# Patient Record
Sex: Female | Born: 1935 | Race: White | Hispanic: No | Smoking: Former smoker
Health system: Southern US, Community
[De-identification: ages and names within clinical notes are randomized; demographics above are authoritative.]

## PROBLEM LIST (undated history)

## (undated) DIAGNOSIS — F419 Anxiety disorder, unspecified: Secondary | ICD-10-CM

## (undated) DIAGNOSIS — I10 Essential (primary) hypertension: Secondary | ICD-10-CM

## (undated) DIAGNOSIS — J811 Chronic pulmonary edema: Secondary | ICD-10-CM

## (undated) DIAGNOSIS — D649 Anemia, unspecified: Secondary | ICD-10-CM

## (undated) DIAGNOSIS — I5189 Other ill-defined heart diseases: Secondary | ICD-10-CM

## (undated) DIAGNOSIS — E876 Hypokalemia: Secondary | ICD-10-CM

## (undated) DIAGNOSIS — M6281 Muscle weakness (generalized): Secondary | ICD-10-CM

## (undated) DIAGNOSIS — F329 Major depressive disorder, single episode, unspecified: Secondary | ICD-10-CM

## (undated) DIAGNOSIS — N183 Chronic kidney disease, stage 3 (moderate): Secondary | ICD-10-CM

## (undated) DIAGNOSIS — F29 Unspecified psychosis not due to a substance or known physiological condition: Secondary | ICD-10-CM

## (undated) DIAGNOSIS — E7800 Pure hypercholesterolemia, unspecified: Secondary | ICD-10-CM

## (undated) DIAGNOSIS — R011 Cardiac murmur, unspecified: Secondary | ICD-10-CM

## (undated) DIAGNOSIS — M1990 Unspecified osteoarthritis, unspecified site: Secondary | ICD-10-CM

## (undated) DIAGNOSIS — I509 Heart failure, unspecified: Secondary | ICD-10-CM

## (undated) DIAGNOSIS — F0390 Unspecified dementia without behavioral disturbance: Secondary | ICD-10-CM

## (undated) DIAGNOSIS — L03116 Cellulitis of left lower limb: Secondary | ICD-10-CM

## (undated) DIAGNOSIS — R1310 Dysphagia, unspecified: Secondary | ICD-10-CM

## (undated) HISTORY — DX: Cardiac murmur, unspecified: R01.1

## (undated) HISTORY — DX: Muscle weakness (generalized): M62.81

## (undated) HISTORY — DX: Dysphagia, unspecified: R13.10

## (undated) HISTORY — DX: Unspecified dementia, unspecified severity, without behavioral disturbance, psychotic disturbance, mood disturbance, and anxiety: F03.90

## (undated) HISTORY — PX: KIDNEY CYST REMOVAL: SHX684

## (undated) HISTORY — DX: Unspecified psychosis not due to a substance or known physiological condition: F29

## (undated) HISTORY — DX: Hypokalemia: E87.6

## (undated) HISTORY — DX: Anemia, unspecified: D64.9

## (undated) HISTORY — DX: Major depressive disorder, single episode, unspecified: F32.9

## (undated) HISTORY — DX: Heart failure, unspecified: I50.9

---

## 2002-03-04 ENCOUNTER — Other Ambulatory Visit: Admission: RE | Admit: 2002-03-04 | Discharge: 2002-03-04 | Payer: Self-pay | Admitting: Family Medicine

## 2002-03-15 ENCOUNTER — Ambulatory Visit (HOSPITAL_COMMUNITY): Admission: RE | Admit: 2002-03-15 | Discharge: 2002-03-15 | Payer: Self-pay | Admitting: Family Medicine

## 2004-11-05 ENCOUNTER — Other Ambulatory Visit: Admission: RE | Admit: 2004-11-05 | Discharge: 2004-11-05 | Payer: Self-pay | Admitting: Family Medicine

## 2008-12-14 ENCOUNTER — Ambulatory Visit (HOSPITAL_COMMUNITY): Admission: RE | Admit: 2008-12-14 | Discharge: 2008-12-14 | Payer: Self-pay | Admitting: Ophthalmology

## 2009-01-17 ENCOUNTER — Ambulatory Visit (HOSPITAL_COMMUNITY): Admission: RE | Admit: 2009-01-17 | Discharge: 2009-01-17 | Payer: Self-pay | Admitting: Ophthalmology

## 2009-10-26 ENCOUNTER — Emergency Department (HOSPITAL_COMMUNITY): Admission: EM | Admit: 2009-10-26 | Discharge: 2009-10-26 | Payer: Self-pay | Admitting: Emergency Medicine

## 2010-02-03 ENCOUNTER — Emergency Department (HOSPITAL_COMMUNITY): Admission: EM | Admit: 2010-02-03 | Discharge: 2010-02-03 | Payer: Self-pay | Admitting: Emergency Medicine

## 2011-01-05 LAB — BASIC METABOLIC PANEL
GFR calc Af Amer: >60 mL/min (ref >60)
GFR calc non Af Amer: 53 mL/min — ABNORMAL LOW (ref >60)
Glucose, Bld: 166 mg/dL — ABNORMAL HIGH (ref 70–99)
Sodium: 139 mEq/L (ref 135–145)

## 2011-01-05 LAB — COMPREHENSIVE METABOLIC PANEL
ALT: 9 U/L (ref 0–35)
Albumin: 3.4 g/dL — ABNORMAL LOW (ref 3.5–5.2)
BUN: 13 mg/dL (ref 6–23)
Calcium: 9 mg/dL (ref 8.4–10.5)
GFR calc Af Amer: >60 mL/min (ref >60)
Glucose, Bld: 168 mg/dL — ABNORMAL HIGH (ref 70–99)
Total Protein: 6.8 g/dL (ref 6.0–8.3)

## 2011-01-05 LAB — URINE MICROSCOPIC-ADD ON

## 2011-01-05 LAB — URINALYSIS, ROUTINE W REFLEX MICROSCOPIC
Glucose, UA: NEGATIVE mg/dL
Urobilinogen, UA: 0.2 mg/dL (ref 0.0–1.0)

## 2011-01-05 LAB — DIFFERENTIAL
Basophils Absolute: 0 10*3/uL (ref 0.0–0.1)
Basophils Relative: 1 % (ref 0–1)
Neutro Abs: 3.1 10*3/uL (ref 1.7–7.7)
Neutrophils Relative %: 65 % (ref 43–77)

## 2011-01-05 LAB — CBC
MCHC: 33 g/dL (ref 30.0–36.0)
RDW: 13.8 % (ref 11.5–15.5)

## 2011-01-05 LAB — T4, FREE: Free T4: 1.38 ng/dL (ref 0.80–1.80)

## 2011-02-04 LAB — BASIC METABOLIC PANEL
CO2: 31 mEq/L (ref 19–32)
Calcium: 10 mg/dL (ref 8.4–10.5)
Creatinine, Ser: 0.99 mg/dL (ref 0.4–1.2)
GFR calc Af Amer: >60 mL/min (ref >60)
GFR calc non Af Amer: 55 mL/min — ABNORMAL LOW (ref >60)
Sodium: 141 mEq/L (ref 135–145)

## 2011-06-02 ENCOUNTER — Encounter: Payer: Self-pay | Admitting: *Deleted

## 2011-06-02 ENCOUNTER — Emergency Department (HOSPITAL_COMMUNITY): Payer: Medicare Other

## 2011-06-02 ENCOUNTER — Emergency Department (HOSPITAL_COMMUNITY)
Admission: EM | Admit: 2011-06-02 | Discharge: 2011-06-03 | Disposition: A | Discharge status: Home / Self Care | Payer: Medicare Other | Attending: Emergency Medicine | Admitting: Emergency Medicine

## 2011-06-02 ENCOUNTER — Other Ambulatory Visit: Payer: Self-pay

## 2011-06-02 DIAGNOSIS — K5289 Other specified noninfective gastroenteritis and colitis: Secondary | ICD-10-CM | POA: Insufficient documentation

## 2011-06-02 DIAGNOSIS — I10 Essential (primary) hypertension: Secondary | ICD-10-CM | POA: Insufficient documentation

## 2011-06-02 DIAGNOSIS — E7800 Pure hypercholesterolemia, unspecified: Secondary | ICD-10-CM | POA: Insufficient documentation

## 2011-06-02 DIAGNOSIS — F411 Generalized anxiety disorder: Secondary | ICD-10-CM | POA: Insufficient documentation

## 2011-06-02 HISTORY — DX: Anxiety disorder, unspecified: F41.9

## 2011-06-02 HISTORY — DX: Unspecified osteoarthritis, unspecified site: M19.90

## 2011-06-02 HISTORY — DX: Pure hypercholesterolemia, unspecified: E78.00

## 2011-06-02 HISTORY — DX: Essential (primary) hypertension: I10

## 2011-06-02 LAB — COMPREHENSIVE METABOLIC PANEL
ALT: 11 U/L (ref 0–35)
BUN: 32 mg/dL — ABNORMAL HIGH (ref 6–23)
CO2: 22 mEq/L (ref 19–32)
Calcium: 9.8 mg/dL (ref 8.4–10.5)
Creatinine, Ser: 1.51 mg/dL — ABNORMAL HIGH (ref 0.50–1.10)
GFR calc Af Amer: 40 mL/min — ABNORMAL LOW (ref >60)
GFR calc non Af Amer: 33 mL/min — ABNORMAL LOW (ref >60)
Glucose, Bld: 164 mg/dL — ABNORMAL HIGH (ref 70–99)
Total Protein: 7.6 g/dL (ref 6.0–8.3)

## 2011-06-02 LAB — URINALYSIS, ROUTINE W REFLEX MICROSCOPIC
Ketones, ur: NEGATIVE mg/dL
Leukocytes, UA: NEGATIVE
Nitrite: NEGATIVE
Protein, ur: NEGATIVE mg/dL

## 2011-06-02 LAB — CARDIAC PANEL(CRET KIN+CKTOT+MB+TROPI)
CK, MB: 2.2 ng/mL (ref 0.3–4.0)
Total CK: 54 U/L (ref 7–177)

## 2011-06-02 LAB — DIFFERENTIAL
Basophils Absolute: 0 10*3/uL (ref 0.0–0.1)
Basophils Relative: 0 % (ref 0–1)
Neutro Abs: 8.3 10*3/uL — ABNORMAL HIGH (ref 1.7–7.7)
Neutrophils Relative %: 87 % — ABNORMAL HIGH (ref 43–77)

## 2011-06-02 LAB — CBC
HCT: 40.2 % (ref 36.0–46.0)
Hemoglobin: 13.1 g/dL (ref 12.0–15.0)
WBC: 9.5 10*3/uL (ref 4.0–10.5)

## 2011-06-02 LAB — LIPASE, BLOOD: Lipase: 29 U/L (ref 11–59)

## 2011-06-02 MED ORDER — IOHEXOL 300 MG/ML  SOLN
80.0000 mL | Freq: Once | INTRAMUSCULAR | Status: AC | PRN
  Administered 2011-06-02: 80 mL via INTRAVENOUS

## 2011-06-02 MED ORDER — SODIUM CHLORIDE 0.9 % IV SOLN
INTRAVENOUS | Status: DC
  Administered 2011-06-02: 21:00:00 via INTRAVENOUS

## 2011-06-02 NOTE — ED Notes (Signed)
Episode of abdominal pain with diaphoresis prior to arrival

## 2011-06-02 NOTE — ED Notes (Signed)
Performed by Stephens Shire NT assisted by [facility] NT. Clean clear in color. Urinating without difficulty.

## 2011-06-02 NOTE — ED Provider Notes (Signed)
History     CSN: [id number] Arrival date & time: 06/02/2011  7:52 PM  Chief Complaint  Patient presents with  . Abdominal Pain   HPI  Pt was seen at 2005.  Per pt, c/o gradual onset and persistence of constant generalized abd "pain" that began PTA.  Pt describes the abd pain as "cramping."  States she was sitting on the commode "for a while" to try to have a BM, only urinated, then "began to sweat" and feel "lightheaded."  Denies syncope, no CP/palpitations, no SOB/cough, no back pain, no focal motor weakness, no tingling/numbness in extremities, no visual changes, no headache, no falls, no black or blood in stools, no N/V/D.     Past Medical History  Diagnosis Date  . Arthritis   . Hypercholesteremia   . Hypertension   . Anxiety     Past Surgical History  Procedure Date  . Kidney cyst removal     No family history on file.  History  Substance Use Topics  . Smoking status: Not on file  . Smokeless tobacco: Not on file  . Alcohol Use: No    OB History    Grav Para Term Preterm Abortions TAB SAB Ect Mult Living                  Review of Systems ROS: Statement: All systems negative except as marked or noted in the HPI; Constitutional: Negative for fever and chills. ; ; Eyes: Negative for eye pain and discharge. ; ; ENMT: Negative for ear pain, hoarseness, nasal congestion, sinus pressure and sore throat. ; ; Cardiovascular: Negative for chest pain, palpitations, dyspnea and peripheral edema. +diaphoresis. ; Respiratory: Negative for cough, wheezing and stridor. ; ; Gastrointestinal: Negative for nausea, vomiting, diarrhea and +abdominal pain/cramping. No black or blood in stools ; ; Genitourinary: Negative for dysuria, flank pain and hematuria. ; ; Musculoskeletal: Negative for back pain and neck pain. ; ; Skin: Negative for rash and skin lesion. ; ; Neuro: Negative for headache, lightheadedness and neck stiffness.  No visual changes, no focal motor weakness, no  tingling/numbness in extremities.      Physical Exam  BP 117/48  Pulse 89  Temp(Src) 97.4 F (36.3 C) (Oral)  Resp 16  Ht 5\' 3"  (1.6 m)  Wt 197 lb (89.359 kg)  BMI 34.90 kg/m2  SpO2 99%  Physical Exam 2010: Physical examination:  Nursing notes reviewed; Vital signs and O2 SAT reviewed;  Constitutional: Well developed, Well nourished, In no acute distress; Head:  Normocephalic, atraumatic; Eyes: EOMI, PERRL, No scleral icterus; ENMT: Mouth and pharynx normal, Mucous membranes dry; Neck: Supple, Full range of motion, No lymphadenopathy; Cardiovascular: Regular rate and rhythm, No murmur, rub, or gallop; Respiratory: Breath sounds clear & equal bilaterally, No rales, rhonchi, wheezes, or rub, Normal respiratory effort/excursion; Chest: Nontender, Movement normal; Abdomen: Soft, +mild diffuse TTP, no rebound or guarding, no rash, Nondistended, Normal bowel sounds; Genitourinary: No CVA tenderness; Extremities: Pulses normal, No tenderness, No edema, No calf edema or asymmetry.; Neuro: AA&Ox3, Major CN grossly intact. Speech clear, no facial droop.  No gross focal motor or sensory deficits in extremities.; Skin: Color normal, Warm, Dry.   ED Course  Procedures  MDM MDM Reviewed: nursing note, vitals and previous chart Reviewed previous: ECG and labs Interpretation: labs, ECG, x-ray and CT scan    Date: 06/02/2011  Rate: 84  Rhythm: normal sinus rhythm  QRS Axis: left  Intervals: normal  ST/T Wave abnormalities: normal  Conduction Disutrbances:none  Narrative Interpretation:   Old EKG Reviewed: unchanged, no significant changes from previous EKG dated 12/08/2008.   Results for orders placed during the hospital encounter of 06/02/11  CARDIAC PANEL(CRET KIN+CKTOT+MB+TROPI)      Component Value Range   Total CK 54  7 - 177 (U/L)   CK, MB 2.2  0.3 - 4.0 (ng/mL)   Troponin I <0.30  <0.30 (ng/mL)   Relative Index RELATIVE INDEX IS INVALID  0.0 - 2.5   CBC      Component Value  Range   WBC 9.5  4.0 - 10.5 (K/uL)   RBC 4.42  3.87 - 5.11 (MIL/uL)   Hemoglobin 13.1  12.0 - 15.0 (g/dL)   HCT 16.1  09.6 - 04.5 (%)   MCV 91.0  78.0 - 100.0 (fL)   MCH 29.6  26.0 - 34.0 (pg)   MCHC 32.6  30.0 - 36.0 (g/dL)   RDW 40.9  81.1 - 91.4 (%)   Platelets 158  150 - 400 (K/uL)  DIFFERENTIAL      Component Value Range   Neutrophils Relative 87 (*) 43 - 77 (%)   Neutro Abs 8.3 (*) 1.7 - 7.7 (K/uL)   Lymphocytes Relative 10 (*) 12 - 46 (%)   Lymphs Abs 0.9  0.7 - 4.0 (K/uL)   Monocytes Relative 3  3 - 12 (%)   Monocytes Absolute 0.3  0.1 - 1.0 (K/uL)   Eosinophils Relative 1  0 - 5 (%)   Eosinophils Absolute 0.1  0.0 - 0.7 (K/uL)   Basophils Relative 0  0 - 1 (%)   Basophils Absolute 0.0  0.0 - 0.1 (K/uL)  COMPREHENSIVE METABOLIC PANEL      Component Value Range   Sodium 138  135 - 145 (mEq/L)   Potassium 4.6  3.5 - 5.1 (mEq/L)   Chloride 102  96 - 112 (mEq/L)   CO2 22  19 - 32 (mEq/L)   Glucose, Bld 164 (*) 70 - 99 (mg/dL)   BUN 32 (*) 6 - 23 (mg/dL)   Creatinine, Ser 7.82 (*) 0.50 - 1.10 (mg/dL)   Calcium 9.8  8.4 - 95.6 (mg/dL)   Total Protein 7.6  6.0 - 8.3 (g/dL)   Albumin 3.8  3.5 - 5.2 (g/dL)   AST 16  0 - 37 (U/L)   ALT 11  0 - 35 (U/L)   Alkaline Phosphatase 92  39 - 117 (U/L)   Total Bilirubin 0.2 (*) 0.3 - 1.2 (mg/dL)   GFR calc non Af Amer 33 (*) >60 (mL/min)   GFR calc Af Amer 40 (*) >60 (mL/min)  LIPASE, BLOOD      Component Value Range   Lipase 29  11 - 59 (U/L)  URINALYSIS, ROUTINE W REFLEX MICROSCOPIC      Component Value Range   Color, Urine YELLOW  YELLOW    Appearance CLEAR  CLEAR    Specific Gravity, Urine 1.025  1.005 - 1.030    pH 5.5  5.0 - 8.0    Glucose, UA NEGATIVE  NEGATIVE (mg/dL)   Hgb urine dipstick NEGATIVE  NEGATIVE    Bilirubin Urine NEGATIVE  NEGATIVE    Ketones, ur NEGATIVE  NEGATIVE (mg/dL)   Protein, ur NEGATIVE  NEGATIVE (mg/dL)   Urobilinogen, UA 0.2  0.0 - 1.0 (mg/dL)   Nitrite NEGATIVE  NEGATIVE    Leukocytes, UA  NEGATIVE  NEGATIVE    Results for SHENIA, ALAN (MRN [id number]) as of 06/03/2011 01:25  Ref. Range 12/08/2008  14:40 10/26/2009 12:51 10/26/2009 14:36 06/02/2011 20:37  BUN Latest Range: 6-23 mg/dL 16 13 13  32 (H)  Creat Latest Range: 0.50-1.10 mg/dL 1.61 0.96 0.45 4.09 (H)   Dg Chest 2 View  06/02/2011  *RADIOLOGY REPORT*  Clinical Data: Abdominal pain.  Weakness.  CHEST - 2 VIEW  Comparison: None.  Findings: Thoracic spondylosis noted. The patient is rotated to the left on today's exam, resulting in reduced diagnostic sensitivity and specificity.  Cardiac and mediastinal contours appear normal.  The lungs appear clear.  No pleural effusion is identified.  IMPRESSION:  1.  Thoracic spondylosis. 2.   Otherwise, no significant abnormality identified.  Original Report Authenticated By: Dellia Cloud, M.D.   Ct Abdomen Pelvis W Contrast  06/02/2011  *RADIOLOGY REPORT*  Clinical Data: Abdominal pain.  Diaphoresis.  CT ABDOMEN AND PELVIS WITH CONTRAST  Technique:  Multidetector CT imaging of the abdomen and pelvis was performed following the standard protocol during bolus administration of intravenous contrast.  Contrast: 80 ml Omnipaque-300  Comparison: None.  Findings: The liver, spleen, pancreas, and adrenal glands appear unremarkable.  The gallbladder and biliary system appear unremarkable.  No pathologic retroperitoneal or porta hepatis adenopathy is identified.  Simple appearing 3.9 cm left lower pole renal cyst noted.  The appendix appears normal.  There is borderline wall thickening in the descending colon which may reflect mild colitis.  The remainder of the colon appears unremarkable.  The uterus and adnexa appear unremarkable.  Urinary bladder appears unremarkable.  Lumbar spondylosis and degenerative disc disease noted along with lumbar epidural lipomatosis.  There is likely foraminal stenosis on the left at the L4-5 and L5-S1 levels.  There are scattered air-fluid levels in nondilated small bowel.   IMPRESSION:  1.  Potential wall thickening in the descending colon, query mild colitis. 2.  Small air-fluid levels in nondilated small bowel which could be reflection of mild enteritis or mild ileus. 4.  Benign appearing left renal cyst. 5.  Lumbar spondylosis and degenerative disc disease.  Original Report Authenticated By: Dellia Cloud, M.D.    0100:  Pt ambulated in ED with steady gait, easy resps.  Has used bathroom without N/V/D.  Not orthostatic.  WBC normal, not acidotic.  Afebrile.  Mild Cr elevation today.  Given IVF and encouraged to increase PO over next several days.  Has tol PO well while in ED.  Pt wants to go home now and family wants to take pt home now.  Will give first dose of abx for possible mild colitis on CT A/P.  Aorta normal.  Dx testing d/w pt and family.  Questions answered.  Verb understanding, agreeable to d/c home with outpt f/u.       Vashon Arch Allison Quarry, DO 06/03/11 1453

## 2011-06-03 MED ORDER — METRONIDAZOLE 500 MG PO TABS: 500.0000 mg | ORAL_TABLET | Freq: Two times a day (BID) | ORAL | Status: AC

## 2011-06-03 MED ORDER — CIPROFLOXACIN HCL 250 MG PO TABS
500.0000 mg | ORAL_TABLET | Freq: Once | ORAL | Status: AC
  Administered 2011-06-03: 500 mg via ORAL
  Filled 2011-06-03: qty 2

## 2011-06-03 MED ORDER — CIPROFLOXACIN HCL 500 MG PO TABS: 500.0000 mg | ORAL_TABLET | Freq: Two times a day (BID) | ORAL | Status: AC

## 2011-06-03 MED ORDER — METRONIDAZOLE 500 MG PO TABS
500.0000 mg | ORAL_TABLET | Freq: Once | ORAL | Status: AC
  Administered 2011-06-03: 500 mg via ORAL
  Filled 2011-06-03: qty 1

## 2011-06-03 NOTE — ED Notes (Signed)
Patient ambulatory to bathroom with steady gait.  Patient states she only urinated.

## 2011-06-04 LAB — URINE CULTURE: Culture  Setup Time: 201208141402

## 2013-04-02 ENCOUNTER — Encounter (HOSPITAL_COMMUNITY): Payer: Self-pay | Admitting: Emergency Medicine

## 2013-04-02 ENCOUNTER — Emergency Department (HOSPITAL_COMMUNITY)
Admission: EM | Admit: 2013-04-02 | Discharge: 2013-04-02 | Disposition: A | Discharge status: Home / Self Care | Payer: Medicare Other | Attending: Emergency Medicine | Admitting: Emergency Medicine

## 2013-04-02 ENCOUNTER — Emergency Department (HOSPITAL_COMMUNITY): Payer: Medicare Other

## 2013-04-02 DIAGNOSIS — S81809A Unspecified open wound, unspecified lower leg, initial encounter: Secondary | ICD-10-CM | POA: Insufficient documentation

## 2013-04-02 DIAGNOSIS — S59909A Unspecified injury of unspecified elbow, initial encounter: Secondary | ICD-10-CM | POA: Insufficient documentation

## 2013-04-02 DIAGNOSIS — S81811A Laceration without foreign body, right lower leg, initial encounter: Secondary | ICD-10-CM

## 2013-04-02 DIAGNOSIS — Y9289 Other specified places as the place of occurrence of the external cause: Secondary | ICD-10-CM | POA: Insufficient documentation

## 2013-04-02 DIAGNOSIS — F329 Major depressive disorder, single episode, unspecified: Secondary | ICD-10-CM | POA: Insufficient documentation

## 2013-04-02 DIAGNOSIS — W010XXA Fall on same level from slipping, tripping and stumbling without subsequent striking against object, initial encounter: Secondary | ICD-10-CM | POA: Insufficient documentation

## 2013-04-02 DIAGNOSIS — S6990XA Unspecified injury of unspecified wrist, hand and finger(s), initial encounter: Secondary | ICD-10-CM | POA: Insufficient documentation

## 2013-04-02 DIAGNOSIS — Z79899 Other long term (current) drug therapy: Secondary | ICD-10-CM | POA: Insufficient documentation

## 2013-04-02 DIAGNOSIS — Z23 Encounter for immunization: Secondary | ICD-10-CM | POA: Insufficient documentation

## 2013-04-02 DIAGNOSIS — S0003XA Contusion of scalp, initial encounter: Secondary | ICD-10-CM | POA: Insufficient documentation

## 2013-04-02 DIAGNOSIS — F411 Generalized anxiety disorder: Secondary | ICD-10-CM | POA: Insufficient documentation

## 2013-04-02 DIAGNOSIS — Z8639 Personal history of other endocrine, nutritional and metabolic disease: Secondary | ICD-10-CM | POA: Insufficient documentation

## 2013-04-02 DIAGNOSIS — F3289 Other specified depressive episodes: Secondary | ICD-10-CM | POA: Insufficient documentation

## 2013-04-02 DIAGNOSIS — W19XXXA Unspecified fall, initial encounter: Secondary | ICD-10-CM

## 2013-04-02 DIAGNOSIS — I10 Essential (primary) hypertension: Secondary | ICD-10-CM | POA: Insufficient documentation

## 2013-04-02 DIAGNOSIS — S0083XA Contusion of other part of head, initial encounter: Secondary | ICD-10-CM

## 2013-04-02 DIAGNOSIS — W1809XA Striking against other object with subsequent fall, initial encounter: Secondary | ICD-10-CM | POA: Insufficient documentation

## 2013-04-02 DIAGNOSIS — M129 Arthropathy, unspecified: Secondary | ICD-10-CM | POA: Insufficient documentation

## 2013-04-02 DIAGNOSIS — Y9389 Activity, other specified: Secondary | ICD-10-CM | POA: Insufficient documentation

## 2013-04-02 DIAGNOSIS — Z862 Personal history of diseases of the blood and blood-forming organs and certain disorders involving the immune mechanism: Secondary | ICD-10-CM | POA: Insufficient documentation

## 2013-04-02 DIAGNOSIS — S81009A Unspecified open wound, unspecified knee, initial encounter: Secondary | ICD-10-CM | POA: Insufficient documentation

## 2013-04-02 MED ORDER — LIDOCAINE-EPINEPHRINE (PF) 2 %-1:200000 IJ SOLN
INTRAMUSCULAR | Status: AC
  Filled 2013-04-02: qty 20

## 2013-04-02 MED ORDER — TETANUS-DIPHTH-ACELL PERTUSSIS 5-2.5-18.5 LF-MCG/0.5 IM SUSP
0.5000 mL | Freq: Once | INTRAMUSCULAR | Status: AC
  Administered 2013-04-02: 0.5 mL via INTRAMUSCULAR
  Filled 2013-04-02: qty 0.5

## 2013-04-02 MED ORDER — BACITRACIN ZINC 500 UNIT/GM EX OINT
TOPICAL_OINTMENT | CUTANEOUS | Status: AC
  Administered 2013-04-02: 1
  Filled 2013-04-02: qty 0.9

## 2013-04-02 NOTE — ED Notes (Signed)
Unknown date of last tetanus shot. Denies taking blood thinners. Alert/oriented x 4. No distress.

## 2013-04-02 NOTE — ED Notes (Signed)
Discharge instructions reviewed with pt, questions answered. Pt verbalized understanding.  

## 2013-04-02 NOTE — ED Notes (Signed)
Patient arrives with family member with c/o fall today down three steps. Patient reports slipping. Family reports it is three brick stairs and patient landed on concrete pad at the bottom. She did hit her head. Denies LOC. Abrasion to bilateral forearms. Laceration to right leg just below the knee. Abrasion noted to right periorbital area.

## 2013-04-02 NOTE — ED Notes (Signed)
Suture cart at bedside 

## 2013-04-02 NOTE — ED Provider Notes (Addendum)
History     CSN: [id number]  Arrival date & time 04/02/13  1635   First MD Initiated Contact with Patient 04/02/13 1652      Chief Complaint  Patient presents with  . Fall    (Consider location/radiation/quality/duration/timing/severity/associated sxs/prior treatment) Patient is a 79 y.o. female presenting with fall. The history is provided by the patient.  Fall This is a new problem. Pertinent negatives include no chest pain, no abdominal pain, no headaches and no shortness of breath.   patient tripped and fell down some stairs. She states she has pain in her right knee, head, right forearm, and left forearm. No loss of consciousness. Congestion. No abdominal pain. No confusion. She is not on anticoagulation. No difficulty breathing. No nausea vomiting or diarrhea.  Past Medical History  Diagnosis Date  . Arthritis   . Hypercholesteremia   . Hypertension   . Anxiety     Past Surgical History  Procedure Laterality Date  . Kidney cyst removal      No family history on file.  History  Substance Use Topics  . Smoking status: Not on file  . Smokeless tobacco: Not on file  . Alcohol Use: No    OB History   Grav Para Term Preterm Abortions TAB SAB Ect Mult Living                  Review of Systems  Constitutional: Negative for activity change and appetite change.  HENT: Negative for neck stiffness.   Eyes: Negative for pain.  Respiratory: Negative for chest tightness and shortness of breath.   Cardiovascular: Negative for chest pain and leg swelling.  Gastrointestinal: Negative for nausea, vomiting, abdominal pain and diarrhea.  Genitourinary: Negative for flank pain.  Musculoskeletal: Negative for back pain.  Skin: Positive for wound. Negative for rash.  Neurological: Negative for weakness, numbness and headaches.  Psychiatric/Behavioral: Negative for behavioral problems.    Allergies  Meloxicam  Home Medications   Current Outpatient Rx  Name  Route  Sig   Dispense  Refill  . acetaminophen (TYLENOL) 500 MG tablet   Oral   Take 500 mg by mouth every 6 (six) hours as needed for pain.         . cetirizine (ZYRTEC) 10 MG tablet   Oral   Take 10 mg by mouth daily.           . citalopram (CELEXA) 20 MG tablet   Oral   Take 20 mg by mouth daily as needed.          Marland Kitchen lisinopril-hydrochlorothiazide (PRINZIDE,ZESTORETIC) 20-25 MG per tablet   Oral   Take 1 tablet by mouth daily.           Bertram Gala Glycol-Propyl Glycol (SYSTANE) 0.4-0.3 % SOLN   Ophthalmic   Apply 1 drop to eye daily as needed (for dry eye relief).           BP 120/54  Pulse 85  Temp(Src) 97.7 F (36.5 C)  Resp 17  Ht 5\' 2"  (1.575 m)  Wt 190 lb (86.183 kg)  BMI 34.74 kg/m2  SpO2 95%  Physical Exam  Nursing note and vitals reviewed. Constitutional: She is oriented to person, place, and time. She appears well-developed and well-nourished.  HENT:  Head: Normocephalic.  Ecchymotic area to right superior orbital ridge and right forehead. Mild swelling. No step-off or deformity. Extraocular movements intact. No vision changes. Right TM normal. Left TM normal.  Eyes: EOM are normal. Pupils are  equal, round, and reactive to light.  Neck: Normal range of motion. Neck supple.  Cardiovascular: Normal rate, regular rhythm and normal heart sounds.   No murmur heard. Pulmonary/Chest: Effort normal and breath sounds normal. No respiratory distress. She has no wheezes. She has no rales.  Abdominal: Soft. Bowel sounds are normal. She exhibits no distension. There is no tenderness. There is no rebound and no guarding.  Musculoskeletal: Normal range of motion. She exhibits tenderness.  Small skin tear to right forearm. No bony tenderness. Range of motion intact. Neurovascular intact distally. No tenderness over elbow or shoulder. Range of motion intact of her neck. No step-off or deformity. Pain with range of motion. Mild ecchymosis/abrasion to left forearm.  Neurovascularly intact distally. 5 cm L-shaped laceration to right lower leg anterior. It is just below the knee. Neurovascularly intact distally. Range of motion intact at knee. Mild ecchymosis to the anterior left knee. Range of motion intact. Minimal tenderness. No step-off or deformity.  Neurological: She is alert and oriented to person, place, and time. No cranial nerve deficit.  Skin: Skin is warm and dry.  Psychiatric: She has a normal mood and affect. Her speech is normal.    ED Course  Procedures (including critical care time)  Labs Reviewed - No data to display Ct Head Wo Contrast  04/02/2013   *RADIOLOGY REPORT*  Clinical Data: Fall.  High blood pressure.  Hypercholesterolemia.  CT HEAD WITHOUT CONTRAST  Technique:  Contiguous axial images were obtained from the base of the skull through the vertex without contrast.  Comparison: 10/26/2009.  Findings: Mild subcutaneous hematoma right lateral supraorbital region without underlying fracture or intracranial hemorrhage.  Prominent small vessel disease type changes without CT evidence of large acute infarct.  No intracranial mass lesion detected on this unenhanced exam.  Mild global atrophy without hydrocephalus.  Vascular calcifications.  Orbital structures appear intact.  IMPRESSION:  Mild subcutaneous hematoma right lateral supraorbital region without underlying fracture or intracranial hemorrhage.  Prominent small vessel disease type changes without CT evidence of large acute infarct.   Original Report Authenticated By: Lacy Duverney, M.D.   Dg Knee Complete 4 Views Right  04/02/2013   *RADIOLOGY REPORT*  Clinical Data: Fall  RIGHT KNEE - COMPLETE 4+ VIEW  Comparison: None.  Findings: Four views of the right knee submitted.  No acute fracture or subluxation.  Mild spurring of patella.  Mild narrowing of patellofemoral joint space.  Mild spurring of the medial tibial plateau.  IMPRESSION: No acute fracture or subluxation.  Mild degenerative  changes.   Original Report Authenticated By: Natasha Mead, M.D.     1. Fall, initial encounter   2. Facial hematoma, initial encounter   3. Laceration of lower leg with complication, right, initial encounter     LACERATION REPAIR Performed by: Billee Cashing Authorized by: Billee Cashing Consent: Verbal consent obtained. Risks and benefits: risks, benefits and alternatives were discussed Consent given by: patient Patient identity confirmed: provided demographic data Prepped and Draped in normal sterile fashion Wound explored  Laceration Location: Right proximal lower leg  Laceration Length: 5cm  No Foreign Bodies seen or palpated  Anesthesia: local infiltration  Local anesthetic: lidocaine 2 % with epinephrine  Anesthetic total: 4 ml  Irrigation method: syringe Amount of cleaning: standard  Skin closure: Prolene 4-0  Number of sutures: 5 simple interrupted   Patient tolerance: Patient tolerated the procedure well with no immediate complications.  MDM  Patient with fall. No fracture is evident. Laceration on right lower  leg closed. His copiously irrigated. She be discharged home follow        Juliet Rude. Rubin Payor, MD 04/02/13 2346  Juliet Rude. Rubin Payor, MD 04/07/13 [phone]

## 2013-04-11 ENCOUNTER — Ambulatory Visit (INDEPENDENT_AMBULATORY_CARE_PROVIDER_SITE_OTHER): Payer: Medicare Other | Admitting: Family Medicine

## 2013-04-11 ENCOUNTER — Encounter: Payer: Self-pay | Admitting: Family Medicine

## 2013-04-11 VITALS — BP 112/72 | HR 81 | Temp 97.1°F | Ht 62.0 in | Wt 194.8 lb

## 2013-04-11 DIAGNOSIS — Z4802 Encounter for removal of sutures: Secondary | ICD-10-CM

## 2013-04-11 DIAGNOSIS — E785 Hyperlipidemia, unspecified: Secondary | ICD-10-CM

## 2013-04-11 DIAGNOSIS — IMO0002 Reserved for concepts with insufficient information to code with codable children: Secondary | ICD-10-CM

## 2013-04-11 DIAGNOSIS — K5730 Diverticulosis of large intestine without perforation or abscess without bleeding: Secondary | ICD-10-CM | POA: Insufficient documentation

## 2013-04-11 DIAGNOSIS — M1990 Unspecified osteoarthritis, unspecified site: Secondary | ICD-10-CM | POA: Insufficient documentation

## 2013-04-11 DIAGNOSIS — I10 Essential (primary) hypertension: Secondary | ICD-10-CM | POA: Insufficient documentation

## 2013-04-11 NOTE — Progress Notes (Signed)
  Subjective:    Patient ID: Sherry Fox, female    DOB: 1930/06/08, 79 y.o.   MRN: [id number]  HPI This 79 y.o. female presents for evaluation of removal of 4 nylon sutures from laceration right leg that occurred last week.  Patient was walking outside on her step and she fell and suffered a laceration on her right leg.   Review of Systems    No chest pain, SOB, HA, dizziness, vision change, N/V, diarrhea, constipation, dysuria, urinary urgency or frequency, myalgias, arthralgias or rash.  Objective:   Physical Exam  Right lower leg below knee with well healed laceration with 4 nylon sutures approximating well.  Sutures removed without difficutly and patient tolerated well.      Assessment & Plan:  Accidental puncture or laceration during procedure, not elsewhere classified 4 nylon sutures removed and patient tolerates well.

## 2013-05-17 ENCOUNTER — Ambulatory Visit (INDEPENDENT_AMBULATORY_CARE_PROVIDER_SITE_OTHER): Payer: Medicare Other | Admitting: Physician Assistant

## 2013-05-17 ENCOUNTER — Encounter: Payer: Self-pay | Admitting: Physician Assistant

## 2013-05-17 VITALS — BP 139/74 | HR 80 | Temp 97.3°F | Wt 191.6 lb

## 2013-05-17 DIAGNOSIS — R002 Palpitations: Secondary | ICD-10-CM

## 2013-05-17 DIAGNOSIS — R5383 Other fatigue: Secondary | ICD-10-CM

## 2013-05-17 DIAGNOSIS — R300 Dysuria: Secondary | ICD-10-CM

## 2013-05-17 DIAGNOSIS — R5381 Other malaise: Secondary | ICD-10-CM

## 2013-05-17 LAB — POCT CBC
HCT, POC: 43.1 % (ref 37.7–47.9)
Hemoglobin: 13.7 g/dL (ref 12.2–16.2)
MCH, POC: 28 pg (ref 27–31.2)
MCHC: 31.8 g/dL (ref 31.8–35.4)
RBC: 4.9 M/uL (ref 4.04–5.48)

## 2013-05-17 LAB — POCT URINALYSIS DIPSTICK
Bilirubin, UA: NEGATIVE
Glucose, UA: NEGATIVE
Ketones, UA: NEGATIVE
Nitrite, UA: NEGATIVE
pH, UA: 6

## 2013-05-17 LAB — POCT UA - MICROSCOPIC ONLY
Casts, Ur, LPF, POC: NEGATIVE
Crystals, Ur, HPF, POC: NEGATIVE
Mucus, UA: NEGATIVE

## 2013-05-17 MED ORDER — CITALOPRAM HYDROBROMIDE 20 MG PO TABS: 20.0000 mg | ORAL_TABLET | Freq: Every day | ORAL | Status: DC | PRN

## 2013-05-17 MED ORDER — CIPROFLOXACIN HCL 500 MG PO TABS: 500.0000 mg | ORAL_TABLET | Freq: Two times a day (BID) | ORAL | Status: DC

## 2013-05-17 NOTE — Patient Instructions (Signed)

## 2013-05-17 NOTE — Addendum Note (Signed)
Addended by: Orma Render F on: 05/17/2013 04:16 PM   Modules accepted: Orders

## 2013-05-17 NOTE — Progress Notes (Signed)
Subjective:     Patient ID: Sherry Fox, female   DOB: [birth year], 79 y.o.   MRN: [id number]  HPI Pt with a several day hx of fatigue, heart palp, and intermit vertigo She states she has been under a lot of stress recently She stays by herself and has also been trying to take care of a sick sister She states at night she is unable to sleep due to racing thoughts Sx are similar to sx of anxiety she had when her husband passed away Prev on Celexa but stopped last yr  Review of Systems  All other systems reviewed and are negative.       Objective:   Physical Exam PERRLA EOMI CN 2-12 intact Ears- canals/TM's nl Oral- no lesions No bruits Heart- RRR w/o M laying and sitting Lungs- CTA Pulses equal in upper ext EKG- see labs BMP, CBC,Hep function, lipid panel, UA- see labs    Assessment:     Fatigue Heart palp Dysuria    Plan:     Given UA will place on Cipro for 1 week Restart the Celexa since this worked well in the past Reviewed SE with pt and daughter Will have her take 1/2 tab x 6 days and then increase F/U in 1 mo sooner if any problems

## 2013-05-18 LAB — HEPATIC FUNCTION PANEL
ALT: 10 IU/L (ref 0–32)
AST: 19 IU/L (ref 0–40)
Albumin: 4.1 g/dL (ref 3.5–4.7)
Alkaline Phosphatase: 92 IU/L (ref 39–117)
Bilirubin, Direct: 0.12 mg/dL (ref 0.00–0.40)
Total Bilirubin: 0.4 mg/dL (ref 0.0–1.2)

## 2013-05-18 LAB — BMP8+EGFR
BUN: 14 mg/dL (ref 8–27)
CO2: 26 mmol/L (ref 18–29)
Calcium: 9.5 mg/dL (ref 8.6–10.2)
Chloride: 103 mmol/L (ref 97–108)
GFR calc Af Amer: 58 mL/min/{1.73_m2} — ABNORMAL LOW (ref >59)
Glucose: 113 mg/dL — ABNORMAL HIGH (ref 65–99)
Potassium: 4.1 mmol/L (ref 3.5–5.2)

## 2013-05-18 LAB — LIPID PANEL
Cholesterol, Total: 248 mg/dL — ABNORMAL HIGH (ref 100–199)
LDL Calculated: 120 mg/dL — ABNORMAL HIGH (ref 0–99)
Triglycerides: 117 mg/dL (ref 0–149)

## 2013-05-18 LAB — URINE CULTURE

## 2013-05-23 ENCOUNTER — Telehealth: Payer: Self-pay | Admitting: Family Medicine

## 2013-05-23 ENCOUNTER — Ambulatory Visit (INDEPENDENT_AMBULATORY_CARE_PROVIDER_SITE_OTHER): Payer: Medicare Other | Admitting: Family Medicine

## 2013-05-23 ENCOUNTER — Encounter: Payer: Self-pay | Admitting: Family Medicine

## 2013-05-23 VITALS — HR 82 | Temp 97.3°F | Wt 186.0 lb

## 2013-05-23 DIAGNOSIS — F411 Generalized anxiety disorder: Secondary | ICD-10-CM

## 2013-05-23 MED ORDER — ALPRAZOLAM 0.25 MG PO TABS: 0.2500 mg | ORAL_TABLET | Freq: Two times a day (BID) | ORAL | Status: DC | PRN

## 2013-05-23 NOTE — Patient Instructions (Signed)

## 2013-05-23 NOTE — Progress Notes (Signed)
  Subjective:    Patient ID: Sherry Fox, female    DOB: 04-Sep-1930, 79 y.o.   MRN: [id number]  HPI This 79 y.o. female presents for evaluation of follow up on her anxiety.   She is accompanied by her daughter who states she is having Anxiety and she is not better since her last visit.  She was having Her sister live with her and she was fine but since her sister became Ill and had to move out she has not been doing well and is anxious.  She Has been started on celexa and has been taking this for the past 2 weeks.   Review of Systems C/o anxiety   No chest pain, SOB, HA, dizziness, vision change, N/V, diarrhea, constipation, dysuria, urinary urgency or frequency, myalgias, arthralgias or rash.  Objective:   Physical Exam  Vital signs noted  Anxious appearing female in NAD.  HEENT - Head atraumatic Normocephalic                Throat - oropharanx wnl Respiratory - Lungs CTA bilateral Cardiac - RRR S1 and S2 without murmur GI - Abdomen soft Nontender and bowel sounds active x 4 Extremities - No edema. Neuro - Grossly intact.      Assessment & Plan:  Anxiety state, unspecified - Plan: ALPRAZolam (XANAX) 0.25 MG tablet one half to one po bid prn anxiety And continue celexa.  Discussed with patient she may need assisted living or start living with family if She does not improve.

## 2013-05-23 NOTE — Telephone Encounter (Signed)
appt made

## 2013-06-16 ENCOUNTER — Encounter (HOSPITAL_COMMUNITY): Payer: Self-pay | Admitting: Emergency Medicine

## 2013-06-16 ENCOUNTER — Emergency Department (HOSPITAL_COMMUNITY)
Admission: EM | Admit: 2013-06-16 | Discharge: 2013-06-16 | Disposition: A | Discharge status: Home / Self Care | Payer: Medicare Other | Attending: Emergency Medicine | Admitting: Emergency Medicine

## 2013-06-16 DIAGNOSIS — F411 Generalized anxiety disorder: Secondary | ICD-10-CM | POA: Insufficient documentation

## 2013-06-16 DIAGNOSIS — R5381 Other malaise: Secondary | ICD-10-CM | POA: Insufficient documentation

## 2013-06-16 DIAGNOSIS — Z8739 Personal history of other diseases of the musculoskeletal system and connective tissue: Secondary | ICD-10-CM | POA: Insufficient documentation

## 2013-06-16 DIAGNOSIS — I10 Essential (primary) hypertension: Secondary | ICD-10-CM | POA: Insufficient documentation

## 2013-06-16 DIAGNOSIS — Z79899 Other long term (current) drug therapy: Secondary | ICD-10-CM | POA: Insufficient documentation

## 2013-06-16 DIAGNOSIS — Z862 Personal history of diseases of the blood and blood-forming organs and certain disorders involving the immune mechanism: Secondary | ICD-10-CM | POA: Insufficient documentation

## 2013-06-16 DIAGNOSIS — Z8639 Personal history of other endocrine, nutritional and metabolic disease: Secondary | ICD-10-CM | POA: Insufficient documentation

## 2013-06-16 DIAGNOSIS — E860 Dehydration: Secondary | ICD-10-CM | POA: Insufficient documentation

## 2013-06-16 LAB — URINALYSIS, ROUTINE W REFLEX MICROSCOPIC
Glucose, UA: NEGATIVE mg/dL
Ketones, ur: NEGATIVE mg/dL
Leukocytes, UA: NEGATIVE
Nitrite: NEGATIVE
Specific Gravity, Urine: 1.01 (ref 1.005–1.030)
pH: 6 (ref 5.0–8.0)

## 2013-06-16 LAB — BASIC METABOLIC PANEL
BUN: 16 mg/dL (ref 6–23)
CO2: 30 mEq/L (ref 19–32)
Chloride: 96 mEq/L (ref 96–112)
Creatinine, Ser: 1.45 mg/dL — ABNORMAL HIGH (ref 0.50–1.10)
GFR calc Af Amer: 38 mL/min — ABNORMAL LOW (ref >90)
Glucose, Bld: 143 mg/dL — ABNORMAL HIGH (ref 70–99)
Potassium: 3.2 mEq/L — ABNORMAL LOW (ref 3.5–5.1)

## 2013-06-16 LAB — CBC WITH DIFFERENTIAL/PLATELET
HCT: 41.1 % (ref 36.0–46.0)
Hemoglobin: 14 g/dL (ref 12.0–15.0)
Lymphocytes Relative: 31 % (ref 12–46)
Lymphs Abs: 1.6 10*3/uL (ref 0.7–4.0)
Monocytes Absolute: 0.3 10*3/uL (ref 0.1–1.0)
Monocytes Relative: 6 % (ref 3–12)
Neutro Abs: 3.3 10*3/uL (ref 1.7–7.7)
Neutrophils Relative %: 63 % (ref 43–77)
RBC: 4.66 MIL/uL (ref 3.87–5.11)

## 2013-06-16 MED ORDER — SODIUM CHLORIDE 0.9 % IV BOLUS (SEPSIS)
1000.0000 mL | Freq: Once | INTRAVENOUS | Status: AC
  Administered 2013-06-16: 1000 mL via INTRAVENOUS

## 2013-06-16 NOTE — ED Provider Notes (Signed)
CSN: [id number]     Arrival date & time 06/16/13  1614 History   First MD Initiated Contact with Patient 06/16/13 1626     Chief Complaint  Patient presents with  . Urinary Retention   (Consider location/radiation/quality/duration/timing/severity/associated sxs/prior Treatment) Patient is a 79 y.o. female presenting with weakness. The history is provided by the patient (pt states she has not urinated much today).  Weakness This is a new problem. The current episode started 12 to 24 hours ago. The problem occurs constantly. The problem has not changed since onset.Pertinent negatives include no chest pain, no abdominal pain and no headaches. Nothing aggravates the symptoms. Nothing relieves the symptoms.    Past Medical History  Diagnosis Date  . Arthritis   . Hypercholesteremia   . Hypertension   . Anxiety    Past Surgical History  Procedure Laterality Date  . Kidney cyst removal     History reviewed. No pertinent family history. History  Substance Use Topics  . Smoking status: Never Smoker   . Smokeless tobacco: Not on file  . Alcohol Use: No   OB History   Grav Para Term Preterm Abortions TAB SAB Ect Mult Living                 Review of Systems  Constitutional: Negative for appetite change and fatigue.  HENT: Negative for congestion, sinus pressure and ear discharge.   Eyes: Negative for discharge.  Respiratory: Negative for cough.   Cardiovascular: Negative for chest pain.  Gastrointestinal: Negative for abdominal pain and diarrhea.  Genitourinary: Negative for frequency and hematuria.  Musculoskeletal: Negative for back pain.  Skin: Negative for rash.  Neurological: Positive for weakness. Negative for seizures and headaches.  Psychiatric/Behavioral: Negative for hallucinations.    Allergies  Meloxicam  Home Medications   Current Outpatient Rx  Name  Route  Sig  Dispense  Refill  . cetirizine (ZYRTEC) 10 MG tablet   Oral   Take 10 mg by mouth daily.            . citalopram (CELEXA) 20 MG tablet   Oral   Take 1 tablet (20 mg total) by mouth daily as needed.   1 tablet   1   . ibuprofen (ADVIL,MOTRIN) 200 MG tablet   Oral   Take 400 mg by mouth every 6 (six) hours as needed for pain.         Marland Kitchen lisinopril-hydrochlorothiazide (PRINZIDE,ZESTORETIC) 20-25 MG per tablet   Oral   Take 1 tablet by mouth daily.           Marland Kitchen acetaminophen (TYLENOL) 500 MG tablet   Oral   Take 500 mg by mouth every 6 (six) hours as needed for pain.         Bertram Gala Glycol-Propyl Glycol (SYSTANE) 0.4-0.3 % SOLN   Ophthalmic   Apply 1 drop to eye daily as needed (for dry eye relief).          BP 126/63  Pulse 77  Temp(Src) 98.1 F (36.7 C) (Oral)  Resp 20  Ht 5\' 2"  (1.575 m)  Wt 185 lb (83.915 kg)  BMI 33.83 kg/m2  SpO2 94% Physical Exam  Constitutional: She is oriented to person, place, and time. She appears well-developed.  HENT:  Head: Normocephalic.  Eyes: Conjunctivae and EOM are normal. No scleral icterus.  Neck: Neck supple. No thyromegaly present.  Cardiovascular: Normal rate and regular rhythm.  Exam reveals no gallop and no friction rub.   No murmur  heard. Pulmonary/Chest: No stridor. She has no wheezes. She has no rales. She exhibits no tenderness.  Abdominal: She exhibits no distension. There is no tenderness. There is no rebound.  Musculoskeletal: Normal range of motion. She exhibits no edema.  Lymphadenopathy:    She has no cervical adenopathy.  Neurological: She is oriented to person, place, and time. Coordination normal.  Skin: No rash noted. No erythema.  Psychiatric: She has a normal mood and affect. Her behavior is normal.    ED Course  Procedures (including critical care time) Labs Review Labs Reviewed  BASIC METABOLIC PANEL - Abnormal; Notable for the following:    Potassium 3.2 (*)    Glucose, Bld 143 (*)    Creatinine, Ser 1.45 (*)    GFR calc non Af Amer 33 (*)    GFR calc Af Amer 38 (*)    All other  components within normal limits  URINALYSIS, ROUTINE W REFLEX MICROSCOPIC  CBC WITH DIFFERENTIAL   Imaging Review No results found.  MDM   1. Dehydration        Benny Lennert, MD 06/16/13 2043

## 2013-06-16 NOTE — ED Notes (Signed)
"  not peeing good" per daughter started yesterday. Pt c/o unable to urinate and when she does it is only driblbling. Pt also c/o feels like something is coming out. Nad. Alert/oreitned.

## 2013-06-16 NOTE — ED Notes (Signed)
No urine output from foley.

## 2013-06-19 ENCOUNTER — Other Ambulatory Visit: Payer: Self-pay | Admitting: Family Medicine

## 2013-07-20 ENCOUNTER — Ambulatory Visit (INDEPENDENT_AMBULATORY_CARE_PROVIDER_SITE_OTHER): Payer: Medicare Other

## 2013-07-20 ENCOUNTER — Ambulatory Visit: Payer: Medicare Other

## 2013-07-20 DIAGNOSIS — Z23 Encounter for immunization: Secondary | ICD-10-CM

## 2013-07-22 ENCOUNTER — Other Ambulatory Visit: Payer: Self-pay | Admitting: Physician Assistant

## 2013-08-23 ENCOUNTER — Other Ambulatory Visit: Payer: Self-pay | Admitting: Family Medicine

## 2013-11-30 ENCOUNTER — Ambulatory Visit (INDEPENDENT_AMBULATORY_CARE_PROVIDER_SITE_OTHER): Payer: Medicare Other | Admitting: Family Medicine

## 2013-11-30 ENCOUNTER — Encounter: Payer: Self-pay | Admitting: Family Medicine

## 2013-11-30 VITALS — BP 138/71 | HR 81 | Temp 97.8°F | Ht 62.0 in | Wt 179.0 lb

## 2013-11-30 DIAGNOSIS — R5383 Other fatigue: Secondary | ICD-10-CM

## 2013-11-30 DIAGNOSIS — R5381 Other malaise: Secondary | ICD-10-CM

## 2013-11-30 DIAGNOSIS — G5600 Carpal tunnel syndrome, unspecified upper limb: Secondary | ICD-10-CM

## 2013-11-30 DIAGNOSIS — N390 Urinary tract infection, site not specified: Secondary | ICD-10-CM

## 2013-11-30 DIAGNOSIS — E119 Type 2 diabetes mellitus without complications: Secondary | ICD-10-CM

## 2013-11-30 LAB — POCT UA - MICROSCOPIC ONLY
Casts, Ur, LPF, POC: NEGATIVE
Crystals, Ur, HPF, POC: NEGATIVE
Mucus, UA: NEGATIVE
Yeast, UA: NEGATIVE

## 2013-11-30 LAB — POCT CBC
Granulocyte percent: 66.5 %G (ref 37–80)
HCT, POC: 42.5 % (ref 37.7–47.9)
Hemoglobin: 13.7 g/dL (ref 12.2–16.2)
Lymph, poc: 1.6 (ref 0.6–3.4)
MCH, POC: 28.8 pg (ref 27–31.2)
MCHC: 32.4 g/dL (ref 31.8–35.4)
MCV: 88.9 fL (ref 80–97)
MPV: 9 fL (ref 0–99.8)
POC Granulocyte: 3.5 (ref 2–6.9)
POC LYMPH PERCENT: 29.9 %L (ref 10–50)
Platelet Count, POC: 170 10*3/uL (ref 142–424)
RBC: 4.8 M/uL (ref 4.04–5.48)
RDW, POC: 13.3 %
WBC: 5.2 10*3/uL (ref 4.6–10.2)

## 2013-11-30 LAB — POCT UA - MICROALBUMIN: Microalbumin Ur, POC: POSITIVE mg/L

## 2013-11-30 MED ORDER — NITROFURANTOIN MONOHYD MACRO 100 MG PO CAPS: 100.0000 mg | ORAL_CAPSULE | Freq: Two times a day (BID) | ORAL | Status: DC

## 2013-11-30 NOTE — Addendum Note (Signed)
Addended by: Orma Render F on: 11/30/2013 04:05 PM   Modules accepted: Orders

## 2013-11-30 NOTE — Progress Notes (Signed)
   Subjective:    Patient ID: Sherry Fox, female    DOB: 07/11/1930, 80 y.o.   MRN: [id number]  HPI This 80 y.o. female presents for evaluation of not feeling well and having tingling and burning in her Arms.  She is having some fatigue.  She is taking motrin   Review of Systems C/o left wrist discomfort No chest pain, SOB, HA, dizziness, vision change, N/V, diarrhea, constipation, dysuria, urinary urgency or frequency, myalgias, arthralgias or rash.     Objective:   Physical Exam  Vital signs noted  Well developed well nourished female.  HEENT - Head atraumatic Normocephalic                Eyes - PERRLA, Conjuctiva - clear Sclera- Clear EOMI Respiratory - Lungs CTA bilateral Cardiac - RRR S1 and S2 without murmur Phalens - pos left  Wrist       Assessment & Plan:  Fatigue - Plan: POCT UA - Microalbumin, POCT UA - Microscopic Only  Carpal tunnel syndrome - Continue motrin and wear cock up splint left wrist.  Deatra Canter FNP

## 2013-12-01 LAB — CMP14+EGFR
ALT: 11 IU/L (ref 0–32)
AST: 18 IU/L (ref 0–40)
Albumin/Globulin Ratio: 1.7 (ref 1.1–2.5)
Albumin: 4.2 g/dL (ref 3.5–4.7)
Alkaline Phosphatase: 80 IU/L (ref 39–117)
BUN/Creatinine Ratio: 17 (ref 11–26)
BUN: 17 mg/dL (ref 8–27)
CO2: 28 mmol/L (ref 18–29)
Calcium: 9.3 mg/dL (ref 8.7–10.3)
Chloride: 99 mmol/L (ref 97–108)
Creatinine, Ser: 1 mg/dL (ref 0.57–1.00)
GFR calc Af Amer: 61 mL/min/{1.73_m2} (ref >59)
GFR calc non Af Amer: 53 mL/min/{1.73_m2} — ABNORMAL LOW (ref >59)
Globulin, Total: 2.5 g/dL (ref 1.5–4.5)
Glucose: 90 mg/dL (ref 65–99)
Potassium: 3.9 mmol/L (ref 3.5–5.2)
Sodium: 140 mmol/L (ref 134–144)
Total Bilirubin: 0.7 mg/dL (ref 0.0–1.2)
Total Protein: 6.7 g/dL (ref 6.0–8.5)

## 2013-12-01 LAB — LIPID PANEL
Chol/HDL Ratio: 2.5 ratio units (ref 0.0–4.4)
Cholesterol, Total: 255 mg/dL — ABNORMAL HIGH (ref 100–199)
HDL: 104 mg/dL (ref >39)
LDL Calculated: 134 mg/dL — ABNORMAL HIGH (ref 0–99)
Triglycerides: 85 mg/dL (ref 0–149)
VLDL Cholesterol Cal: 17 mg/dL (ref 5–40)

## 2013-12-01 LAB — THYROID PANEL WITH TSH
Free Thyroxine Index: 2.5 (ref 1.2–4.9)
T3 Uptake Ratio: 30 % (ref 24–39)
T4, Total: 8.4 ug/dL (ref 4.5–12.0)
TSH: 1.68 u[IU]/mL (ref 0.450–4.500)

## 2013-12-01 LAB — MICROALBUMIN, URINE: Microalbumin, Urine: 27.1 ug/mL — ABNORMAL HIGH (ref 0.0–17.0)

## 2013-12-01 LAB — URINE CULTURE

## 2013-12-05 ENCOUNTER — Telehealth: Payer: Self-pay | Admitting: Family Medicine

## 2013-12-21 ENCOUNTER — Other Ambulatory Visit (INDEPENDENT_AMBULATORY_CARE_PROVIDER_SITE_OTHER): Payer: Medicare Other

## 2013-12-21 DIAGNOSIS — N390 Urinary tract infection, site not specified: Secondary | ICD-10-CM

## 2013-12-21 LAB — POCT URINALYSIS DIPSTICK
Bilirubin, UA: NEGATIVE
Blood, UA: NEGATIVE
Glucose, UA: NEGATIVE
Ketones, UA: NEGATIVE
Nitrite, UA: NEGATIVE
Protein, UA: NEGATIVE
Spec Grav, UA: 1.015
Urobilinogen, UA: NEGATIVE
pH, UA: 5

## 2013-12-21 LAB — POCT UA - MICROSCOPIC ONLY
Bacteria, U Microscopic: NEGATIVE
Casts, Ur, LPF, POC: NEGATIVE
Crystals, Ur, HPF, POC: NEGATIVE
Mucus, UA: NEGATIVE
RBC, urine, microscopic: NEGATIVE
Yeast, UA: NEGATIVE

## 2013-12-21 NOTE — Progress Notes (Signed)
PT CAME IN FOR LABS ONLY 

## 2013-12-28 ENCOUNTER — Telehealth: Payer: Self-pay | Admitting: Family Medicine

## 2013-12-29 NOTE — Telephone Encounter (Signed)
Left message that urine was clear. She is to follow up if problems persist.

## 2014-02-07 ENCOUNTER — Other Ambulatory Visit: Payer: Self-pay | Admitting: Physician Assistant

## 2014-02-08 ENCOUNTER — Other Ambulatory Visit: Payer: Self-pay | Admitting: *Deleted

## 2014-02-08 MED ORDER — CETIRIZINE HCL 10 MG PO TABS: 10.0000 mg | ORAL_TABLET | Freq: Every day | ORAL | Status: DC

## 2014-03-02 ENCOUNTER — Other Ambulatory Visit: Payer: Self-pay

## 2014-03-02 ENCOUNTER — Observation Stay (HOSPITAL_COMMUNITY)
Admission: EM | Admit: 2014-03-02 | Discharge: 2014-03-03 | Disposition: A | Discharge status: Home / Self Care | Payer: Medicare Other | Attending: Family Medicine | Admitting: Family Medicine

## 2014-03-02 ENCOUNTER — Emergency Department (HOSPITAL_COMMUNITY): Payer: Medicare Other

## 2014-03-02 ENCOUNTER — Encounter (HOSPITAL_COMMUNITY): Payer: Self-pay | Admitting: Emergency Medicine

## 2014-03-02 ENCOUNTER — Telehealth: Payer: Self-pay | Admitting: Family Medicine

## 2014-03-02 DIAGNOSIS — G9349 Other encephalopathy: Secondary | ICD-10-CM | POA: Insufficient documentation

## 2014-03-02 DIAGNOSIS — F329 Major depressive disorder, single episode, unspecified: Secondary | ICD-10-CM | POA: Diagnosis present

## 2014-03-02 DIAGNOSIS — I10 Essential (primary) hypertension: Secondary | ICD-10-CM | POA: Insufficient documentation

## 2014-03-02 DIAGNOSIS — Z9181 History of falling: Secondary | ICD-10-CM | POA: Insufficient documentation

## 2014-03-02 DIAGNOSIS — R4182 Altered mental status, unspecified: Secondary | ICD-10-CM | POA: Diagnosis present

## 2014-03-02 DIAGNOSIS — M129 Arthropathy, unspecified: Secondary | ICD-10-CM | POA: Insufficient documentation

## 2014-03-02 DIAGNOSIS — R442 Other hallucinations: Secondary | ICD-10-CM

## 2014-03-02 DIAGNOSIS — F411 Generalized anxiety disorder: Secondary | ICD-10-CM | POA: Insufficient documentation

## 2014-03-02 DIAGNOSIS — G3183 Dementia with Lewy bodies: Secondary | ICD-10-CM

## 2014-03-02 DIAGNOSIS — F29 Unspecified psychosis not due to a substance or known physiological condition: Secondary | ICD-10-CM

## 2014-03-02 DIAGNOSIS — F3289 Other specified depressive episodes: Secondary | ICD-10-CM | POA: Insufficient documentation

## 2014-03-02 DIAGNOSIS — R410 Disorientation, unspecified: Secondary | ICD-10-CM

## 2014-03-02 DIAGNOSIS — R443 Hallucinations, unspecified: Secondary | ICD-10-CM

## 2014-03-02 DIAGNOSIS — E785 Hyperlipidemia, unspecified: Secondary | ICD-10-CM | POA: Insufficient documentation

## 2014-03-02 DIAGNOSIS — E7800 Pure hypercholesterolemia, unspecified: Secondary | ICD-10-CM | POA: Insufficient documentation

## 2014-03-02 DIAGNOSIS — H5316 Psychophysical visual disturbances: Principal | ICD-10-CM | POA: Insufficient documentation

## 2014-03-02 DIAGNOSIS — F32A Depression, unspecified: Secondary | ICD-10-CM

## 2014-03-02 DIAGNOSIS — F0280 Dementia in other diseases classified elsewhere without behavioral disturbance: Secondary | ICD-10-CM | POA: Insufficient documentation

## 2014-03-02 LAB — COMPREHENSIVE METABOLIC PANEL
ALT: 12 U/L (ref 0–35)
AST: 20 U/L (ref 0–37)
Albumin: 3.6 g/dL (ref 3.5–5.2)
Alkaline Phosphatase: 87 U/L (ref 39–117)
BUN: 20 mg/dL (ref 6–23)
CALCIUM: 9.2 mg/dL (ref 8.4–10.5)
CO2: 30 mEq/L (ref 19–32)
Chloride: 103 mEq/L (ref 96–112)
Creatinine, Ser: 1 mg/dL (ref 0.50–1.10)
GFR calc Af Amer: 60 mL/min — ABNORMAL LOW (ref >90)
GFR calc non Af Amer: 51 mL/min — ABNORMAL LOW (ref >90)
Glucose, Bld: 134 mg/dL — ABNORMAL HIGH (ref 70–99)
Potassium: 3.8 mEq/L (ref 3.7–5.3)
SODIUM: 142 meq/L (ref 137–147)
TOTAL PROTEIN: 7.3 g/dL (ref 6.0–8.3)
Total Bilirubin: 0.4 mg/dL (ref 0.3–1.2)

## 2014-03-02 LAB — CBC WITH DIFFERENTIAL/PLATELET
Basophils Absolute: 0 10*3/uL (ref 0.0–0.1)
Basophils Relative: 0 % (ref 0–1)
EOS PCT: 2 % (ref 0–5)
Eosinophils Absolute: 0.1 10*3/uL (ref 0.0–0.7)
HCT: 39.4 % (ref 36.0–46.0)
Hemoglobin: 12.9 g/dL (ref 12.0–15.0)
LYMPHS ABS: 1.7 10*3/uL (ref 0.7–4.0)
LYMPHS PCT: 33 % (ref 12–46)
MCH: 29.4 pg (ref 26.0–34.0)
MCHC: 32.7 g/dL (ref 30.0–36.0)
MCV: 89.7 fL (ref 78.0–100.0)
Monocytes Absolute: 0.3 10*3/uL (ref 0.1–1.0)
Monocytes Relative: 5 % (ref 3–12)
NEUTROS PCT: 60 % (ref 43–77)
Neutro Abs: 3 10*3/uL (ref 1.7–7.7)
Platelets: 180 10*3/uL (ref 150–400)
RBC: 4.39 MIL/uL (ref 3.87–5.11)
RDW: 13.8 % (ref 11.5–15.5)
WBC: 5 10*3/uL (ref 4.0–10.5)

## 2014-03-02 LAB — URINALYSIS, ROUTINE W REFLEX MICROSCOPIC
Bilirubin Urine: NEGATIVE
GLUCOSE, UA: NEGATIVE mg/dL
HGB URINE DIPSTICK: NEGATIVE
Ketones, ur: NEGATIVE mg/dL
Leukocytes, UA: NEGATIVE
Nitrite: NEGATIVE
PROTEIN: NEGATIVE mg/dL
Specific Gravity, Urine: >1.03 — ABNORMAL HIGH (ref 1.005–1.030)
Urobilinogen, UA: 0.2 mg/dL (ref 0.0–1.0)
pH: 6 (ref 5.0–8.0)

## 2014-03-02 LAB — PRO B NATRIURETIC PEPTIDE: PRO B NATRI PEPTIDE: 302.2 pg/mL (ref 0–450)

## 2014-03-02 LAB — LIPASE, BLOOD: Lipase: 13 U/L (ref 11–59)

## 2014-03-02 MED ORDER — SODIUM CHLORIDE 0.9 % IV SOLN
INTRAVENOUS | Status: DC
  Administered 2014-03-03: via INTRAVENOUS

## 2014-03-02 NOTE — ED Notes (Signed)
Daughter states pt is seeing and hearing things that are not there. Daughter states pt has been running to the bathroom a lot.

## 2014-03-02 NOTE — ED Notes (Signed)
Pt is alert and oriented. Lives alone and is able to do light house work.

## 2014-03-02 NOTE — ED Notes (Signed)
Family at bedside. Walked patient to restroom. Back in bed at this  Time.

## 2014-03-02 NOTE — Telephone Encounter (Signed)
Patient developed UTI symptoms yesterday and has been having visual hallucinations today. She is seeing people that aren't really present with her. Advised daughter that she needs to take her to the ED. If the hallucinations are due to the UTI then she may be septic.  Daughter agreed to plan.

## 2014-03-02 NOTE — H&P (Signed)
[facility] History and Physical  Sherry Fox ZOX:[id number] DOB: 09-Mar-1930    PCP:   Rudi HeapMOORE, DONALD, MD   Chief Complaint: visual hallucination when she tries to go to sleep for the past 2 days.  HPI: Sherry Fox is an 80 y.o. female with hx of parkinson disease, hyperlipidemia, depression, brought to the ER by her daughter with 2 days hx of visual hallucinations.  She saw people in her house and was talking to them.  She was afraid to go to sleep.  She hadn't taken any new medication, and has been on no narcotic or psychotrophic medication except for and Celexa which she has been on it.  She took no OTC medication.  In the past, there was another time when she had similar problem and had a UTI.  She had a fall, but didn't hit her had. She denied fever, chills, nausea, vomiting or any focal symptoms.  Evaluation in the ER included a head CT which was negative, no leukocytosis, negative UA, and negative CXR.  She has normal liver fx tests, along with normal renal fx tests.  She was lucid tonight in the ER.  Rewiew of Systems:  Constitutional: Negative for malaise, fever and chills. No significant weight loss or weight gain Eyes: Negative for eye pain, redness and discharge, diplopia, visual changes, or flashes of light. ENMT: Negative for ear pain, hoarseness, nasal congestion, sinus pressure and sore throat. No headaches; tinnitus, drooling, or problem swallowing. Cardiovascular: Negative for chest pain, palpitations, diaphoresis, dyspnea and peripheral edema. ; No orthopnea, PND Respiratory: Negative for cough, hemoptysis, wheezing and stridor. No pleuritic chestpain. Gastrointestinal: Negative for nausea, vomiting, diarrhea, constipation, abdominal pain, melena, blood in stool, hematemesis, jaundice and rectal bleeding.    Genitourinary: Negative for frequency, dysuria, incontinence,flank pain and hematuria; Musculoskeletal: Negative for back pain and neck pain. Negative for swelling and  trauma.;  Skin: . Negative for pruritus, rash, abrasions, bruising and skin lesion.; ulcerations Neuro: Negative for headache, lightheadedness and neck stiffness. Negative for weakness,  altered mental status, extremity weakness, burning feet, involuntary movement, seizure and syncope.  Psych: negative for anxiety, depression, insomnia, tearfulness, panic attacks, hallucinations, paranoia, suicidal or homicidal ideation   Past Medical History  Diagnosis Date  . Arthritis   . Hypercholesteremia   . Hypertension   . Anxiety     Past Surgical History  Procedure Laterality Date  . Kidney cyst removal      Medications:  HOME MEDS: Prior to Admission medications   Medication Sig Start Date End Date Taking? Authorizing Provider  cetirizine (ZYRTEC) 10 MG tablet Take 1 tablet (10 mg total) by mouth daily. 02/08/14  Yes Deatra CanterWilliam J Oxford, FNP  citalopram (CELEXA) 20 MG tablet Take 20 mg by mouth daily as needed. anxiety   Yes Historical Provider, MD  ibuprofen (ADVIL,MOTRIN) 200 MG tablet Take 200 mg by mouth every 6 (six) hours as needed for pain. pain   Yes Historical Provider, MD  lisinopril-hydrochlorothiazide (PRINZIDE,ZESTORETIC) 20-25 MG per tablet Take 1 tablet by mouth daily.   Yes Historical Provider, MD  Polyethyl Glycol-Propyl Glycol (SYSTANE) 0.4-0.3 % SOLN Apply 1 drop to eye daily as needed (for dry eye relief).   Yes Historical Provider, MD     Allergies:  Allergies  Allergen Reactions  . Meloxicam Rash and Other (See Comments)    Reaction is unknown    Social History:   reports that she has never smoked. She does not have any smokeless tobacco history on file.  She reports that she does not drink alcohol or use illicit drugs.  Family History: No family history on file.   Physical Exam: Filed Vitals:   03/02/14 1807 03/02/14 1808 03/02/14 2200  BP:  152/78 133/76  Pulse:  86 85  Temp:  97.5 F (36.4 C)   TempSrc:  Oral   Resp:  16 18  Height: 5\' 2"  (1.575 m)     Weight: 81.647 kg (180 lb)    SpO2:  97% 97%   Blood pressure 133/76, pulse 85, temperature 97.5 F (36.4 C), temperature source Oral, resp. rate 18, height 5\' 2"  (1.575 m), weight 81.647 kg (180 lb), SpO2 97.00%.  GEN:  Pleasant  patient lying in the stretcher in no acute distress; cooperative with exam. PSYCH:  alert and oriented x4; does not appear anxious or depressed; affect is appropriate. HEENT: Mucous membranes pink and anicteric; PERRLA; EOM intact; no cervical lymphadenopathy nor thyromegaly or carotid bruit; no JVD; There were no stridor. Neck is very supple. Breasts:: Not examined CHEST WALL: No tenderness CHEST: Normal respiration, clear to auscultation bilaterally.  HEART: Regular rate and rhythm.  There are no murmur, rub, or gallops.   BACK: No kyphosis or scoliosis; no CVA tenderness ABDOMEN: soft and non-tender; no masses, no organomegaly, normal abdominal bowel sounds; no pannus; no intertriginous candida. There is no rebound and no distention. Rectal Exam: Not done EXTREMITIES: No bone or joint deformity; age-appropriate arthropathy of the hands and knees; no edema; no ulcerations.  There is no calf tenderness. Genitalia: not examined PULSES: 2+ and symmetric SKIN: Normal hydration no rash or ulceration CNS: Cranial nerves 2-12 grossly intact no focal lateralizing neurologic deficit.  Speech is fluent; uvula elevated with phonation, facial symmetry and tongue midline. DTR are normal bilaterally, cerebella exam is intact, barbinski is negative and strengths are equaled bilaterally.  No sensory loss.   Labs on Admission:  Basic Metabolic Panel:  Recent Labs Lab 03/02/14 1835  NA 142  K 3.8  CL 103  CO2 30  GLUCOSE 134*  BUN 20  CREATININE 1.00  CALCIUM 9.2   Liver Function Tests:  Recent Labs Lab 03/02/14 1835  AST 20  ALT 12  ALKPHOS 87  BILITOT 0.4  PROT 7.3  ALBUMIN 3.6    Recent Labs Lab 03/02/14 1835  LIPASE 13   No results found for  this basename: AMMONIA,  in the last 168 hours CBC:  Recent Labs Lab 03/02/14 1835  WBC 5.0  NEUTROABS 3.0  HGB 12.9  HCT 39.4  MCV 89.7  PLT 180   Cardiac Enzymes: No results found for this basename: CKTOTAL, CKMB, CKMBINDEX, TROPONINI,  in the last 168 hours  CBG: No results found for this basename: GLUCAP,  in the last 168 hours   Radiological Exams on Admission: Dg Chest 2 View  03/02/2014   CLINICAL DATA:  Back pain  EXAM: CHEST  2 VIEW  COMPARISON:  06/02/2011  FINDINGS: The heart size and mediastinal contours are within normal limits. Both lungs are clear. The visualized skeletal structures are unremarkable.  IMPRESSION: No active cardiopulmonary disease.   Electronically Signed   By: Alcide Clever M.D.   On: 03/02/2014 21:07   Dg Lumbar Spine Complete  03/02/2014   CLINICAL DATA:  Low back pain following recent injury  EXAM: LUMBAR SPINE - COMPLETE 4+ VIEW  COMPARISON:  None.  FINDINGS: Five lumbar type vertebral bodies are well visualized. Vertebral body height is well maintained. Disc space narrowing is noted  at all lumbar levels with associated osteophytic changes. No acute fractures noted.  IMPRESSION: Degenerative change without acute abnormality.   Electronically Signed   By: Alcide Clever M.D.   On: 03/02/2014 21:06   Dg Ankle Complete Left  03/02/2014   CLINICAL DATA:  Left ankle pain  EXAM: LEFT ANKLE COMPLETE - 3+ VIEW  COMPARISON:  None.  FINDINGS: There is no evidence of fracture, dislocation, or joint effusion. There is no evidence of arthropathy or other focal bone abnormality. Soft tissues are unremarkable.  IMPRESSION: No acute abnormality noted.   Electronically Signed   By: Alcide Clever M.D.   On: 03/02/2014 21:05   Ct Head Wo Contrast  03/02/2014   CLINICAL DATA:  Double vision for 1 day.  Altered mental status.  EXAM: CT HEAD WITHOUT CONTRAST  TECHNIQUE: Contiguous axial images were obtained from the base of the skull through the vertex without intravenous  contrast.  COMPARISON:  CT HEAD W/O CM dated 04/02/2013  FINDINGS: Diffuse cerebral atrophy. Mild ventricular dilatation consistent with central atrophy. Low-attenuation changes throughout the deep white matter consistent with small vessel ischemia. No significant changes since prior study. No mass effect or midline shift. No abnormal extra-axial fluid collections. Gray-white matter junctions are distinct. Basal cisterns are not effaced. No evidence of acute intracranial hemorrhage. No depressed skull fractures. Visualized paranasal sinuses and mastoid air cells are not opacified.  IMPRESSION: No acute intracranial abnormalities. Chronic atrophy and small vessel ischemic changes.   Electronically Signed   By: Burman Nieves M.D.   On: 03/02/2014 21:27    Assessment/Plan Present on Admission:  . Altered mental status . Hypnagogic hallucinations . Morbid obesity . Essential hypertension, benign . Depression  PLAN:  I am not certain why she has hallucinations for 2 nights.  Given she has them mostly going to sleep, I think it may just be hypnagogic hallucination.  She has severe depression, thinking about her husband who died over 6 years ago, so it could be pseudodementia, but less likely.  She hadn't been on any medication that can cause hallucination.  It is unlikely to be a seizure, infection, or any more metabolic derrangement.  Will obtain TSH, B12 and RPR.  I will place a routine neuro consult for tomorrow.  She is stable.  No intervention is required at this time. She is under OBS status.  Other plans as per orders.  Code Status: FULL CODE.   Houston Siren, MD. [facility] Pager [phone] 7pm to 7am.  03/02/2014, 10:36 PM

## 2014-03-02 NOTE — ED Provider Notes (Signed)
CSN: [id number]     Arrival date & time 03/02/14  1759 History   First MD Initiated Contact with Patient 03/02/14 1808     Chief Complaint  Patient presents with  . Altered Mental Status     (Consider location/radiation/quality/duration/timing/severity/associated sxs/prior Treatment) The history is provided by the patient.   80 year old female brought in by her daughter patient lives by herself. Patient's been seen in hearing things for the past 2 days that are not there. Patient also states that running to the bathroom a lot. Mostly came through her daughter. Patient does not appear to be hallucinating currently. Patient is able to answer questions and follow commands. No history of fever no history of nausea and vomiting no history of cough or chest pain or shortness of breath. Questionable history of falls.  Past Medical History  Diagnosis Date  . Arthritis   . Hypercholesteremia   . Hypertension   . Anxiety    Past Surgical History  Procedure Laterality Date  . Kidney cyst removal     No family history on file. History  Substance Use Topics  . Smoking status: Never Smoker   . Smokeless tobacco: Not on file  . Alcohol Use: No   OB History   Grav Para Term Preterm Abortions TAB SAB Ect Mult Living                 Review of Systems  Constitutional: Negative for fever.  HENT: Negative for congestion.   Eyes: Negative for pain.  Respiratory: Negative for shortness of breath.   Cardiovascular: Negative for chest pain.  Gastrointestinal: Negative for nausea, vomiting and abdominal pain.  Genitourinary: Positive for frequency.  Musculoskeletal: Positive for back pain.  Skin: Negative for rash.  Neurological: Negative for headaches.  Hematological: Does not bruise/bleed easily.  Psychiatric/Behavioral: Positive for hallucinations and confusion.      Allergies  Meloxicam  Home Medications   Prior to Admission medications   Medication Sig Start Date End Date  Taking? Authorizing Provider  acetaminophen (TYLENOL) 500 MG tablet Take 500 mg by mouth every 6 (six) hours as needed for pain.    Historical Provider, MD  cetirizine (ZYRTEC) 10 MG tablet Take 1 tablet (10 mg total) by mouth daily. 02/08/14   Deatra CanterWilliam J Oxford, FNP  citalopram (CELEXA) 20 MG tablet TAKE 1 TABLET (20 MG TOTAL) BY MOUTH DAILY AS NEEDED.    Deatra CanterWilliam J Oxford, FNP  ibuprofen (ADVIL,MOTRIN) 200 MG tablet Take 400 mg by mouth every 6 (six) hours as needed for pain.    Historical Provider, MD  lisinopril-hydrochlorothiazide (PRINZIDE,ZESTORETIC) 20-25 MG per tablet TAKE ONE TABLET BY MOUTH ONE TIME DAILY 08/23/13   Ernestina Pennaonald W Moore, MD  nitrofurantoin, macrocrystal-monohydrate, (MACROBID) 100 MG capsule Take 1 capsule (100 mg total) by mouth 2 (two) times daily. 11/30/13   Deatra CanterWilliam J Oxford, FNP  Polyethyl Glycol-Propyl Glycol (SYSTANE) 0.4-0.3 % SOLN Apply 1 drop to eye daily as needed (for dry eye relief).    Historical Provider, MD   BP 152/78  Pulse 86  Temp(Src) 97.5 F (36.4 C) (Oral)  Resp 16  Ht 5\' 2"  (1.575 m)  Wt 180 lb (81.647 kg)  BMI 32.91 kg/m2  SpO2 97% Physical Exam  Nursing note and vitals reviewed. Constitutional: She appears well-developed and well-nourished. No distress.  HENT:  Head: Normocephalic and atraumatic.  Mouth/Throat: Oropharynx is clear and moist.  Eyes: Conjunctivae and EOM are normal. Pupils are equal, round, and reactive to light.  Neck: Normal  range of motion.  Cardiovascular: Normal rate, regular rhythm and normal heart sounds.   Pulmonary/Chest: Effort normal and breath sounds normal. No respiratory distress.  Abdominal: Soft. Bowel sounds are normal. There is no tenderness.  Musculoskeletal: Normal range of motion. She exhibits tenderness.  Mild tenderness and swelling to the lateral part of the left ankle. No obvious deformity. Vascular intact sensory intact.  No tenderness to palpation of the lumbar spine the patient subjectively complains  of pain.  Neurological: She is alert. No cranial nerve deficit. She exhibits normal muscle tone. Coordination normal.  Skin: Skin is warm. No rash noted. No erythema.    ED Course  Procedures (including critical care time) Labs Review Labs Reviewed  URINALYSIS, ROUTINE W REFLEX MICROSCOPIC - Abnormal; Notable for the following:    Specific Gravity, Urine >1.030 (*)    All other components within normal limits  COMPREHENSIVE METABOLIC PANEL - Abnormal; Notable for the following:    Glucose, Bld 134 (*)    GFR calc non Af Amer 51 (*)    GFR calc Af Amer 60 (*)    All other components within normal limits  LIPASE, BLOOD  CBC WITH DIFFERENTIAL  PRO B NATRIURETIC PEPTIDE   Results for orders placed during the hospital encounter of 03/02/14  URINALYSIS, ROUTINE W REFLEX MICROSCOPIC      Result Value Ref Range   Color, Urine YELLOW  YELLOW   APPearance CLEAR  CLEAR   Specific Gravity, Urine >1.030 (*) 1.005 - 1.030   pH 6.0  5.0 - 8.0   Glucose, UA NEGATIVE  NEGATIVE mg/dL   Hgb urine dipstick NEGATIVE  NEGATIVE   Bilirubin Urine NEGATIVE  NEGATIVE   Ketones, ur NEGATIVE  NEGATIVE mg/dL   Protein, ur NEGATIVE  NEGATIVE mg/dL   Urobilinogen, UA 0.2  0.0 - 1.0 mg/dL   Nitrite NEGATIVE  NEGATIVE   Leukocytes, UA NEGATIVE  NEGATIVE  COMPREHENSIVE METABOLIC PANEL      Result Value Ref Range   Sodium 142  137 - 147 mEq/L   Potassium 3.8  3.7 - 5.3 mEq/L   Chloride 103  96 - 112 mEq/L   CO2 30  19 - 32 mEq/L   Glucose, Bld 134 (*) 70 - 99 mg/dL   BUN 20  6 - 23 mg/dL   Creatinine, Ser 4.54  0.50 - 1.10 mg/dL   Calcium 9.2  8.4 - 09.8 mg/dL   Total Protein 7.3  6.0 - 8.3 g/dL   Albumin 3.6  3.5 - 5.2 g/dL   AST 20  0 - 37 U/L   ALT 12  0 - 35 U/L   Alkaline Phosphatase 87  39 - 117 U/L   Total Bilirubin 0.4  0.3 - 1.2 mg/dL   GFR calc non Af Amer 51 (*) >90 mL/min   GFR calc Af Amer 60 (*) >90 mL/min  LIPASE, BLOOD      Result Value Ref Range   Lipase 13  11 - 59 U/L  CBC  WITH DIFFERENTIAL      Result Value Ref Range   WBC 5.0  4.0 - 10.5 K/uL   RBC 4.39  3.87 - 5.11 MIL/uL   Hemoglobin 12.9  12.0 - 15.0 g/dL   HCT 11.9  14.7 - 82.9 %   MCV 89.7  78.0 - 100.0 fL   MCH 29.4  26.0 - 34.0 pg   MCHC 32.7  30.0 - 36.0 g/dL   RDW 56.2  13.0 - 86.5 %  Platelets 180  150 - 400 K/uL   Neutrophils Relative % 60  43 - 77 %   Neutro Abs 3.0  1.7 - 7.7 K/uL   Lymphocytes Relative 33  12 - 46 %   Lymphs Abs 1.7  0.7 - 4.0 K/uL   Monocytes Relative 5  3 - 12 %   Monocytes Absolute 0.3  0.1 - 1.0 K/uL   Eosinophils Relative 2  0 - 5 %   Eosinophils Absolute 0.1  0.0 - 0.7 K/uL   Basophils Relative 0  0 - 1 %   Basophils Absolute 0.0  0.0 - 0.1 K/uL  PRO B NATRIURETIC PEPTIDE      Result Value Ref Range   Pro B Natriuretic peptide (BNP) 302.2  0 - 450 pg/mL     Imaging Review Dg Chest 2 View  03/02/2014   CLINICAL DATA:  Back pain  EXAM: CHEST  2 VIEW  COMPARISON:  06/02/2011  FINDINGS: The heart size and mediastinal contours are within normal limits. Both lungs are clear. The visualized skeletal structures are unremarkable.  IMPRESSION: No active cardiopulmonary disease.   Electronically Signed   By: Alcide Clever M.D.   On: 03/02/2014 21:07   Dg Lumbar Spine Complete  03/02/2014   CLINICAL DATA:  Low back pain following recent injury  EXAM: LUMBAR SPINE - COMPLETE 4+ VIEW  COMPARISON:  None.  FINDINGS: Five lumbar type vertebral bodies are well visualized. Vertebral body height is well maintained. Disc space narrowing is noted at all lumbar levels with associated osteophytic changes. No acute fractures noted.  IMPRESSION: Degenerative change without acute abnormality.   Electronically Signed   By: Alcide Clever M.D.   On: 03/02/2014 21:06   Dg Ankle Complete Left  03/02/2014   CLINICAL DATA:  Left ankle pain  EXAM: LEFT ANKLE COMPLETE - 3+ VIEW  COMPARISON:  None.  FINDINGS: There is no evidence of fracture, dislocation, or joint effusion. There is no evidence of  arthropathy or other focal bone abnormality. Soft tissues are unremarkable.  IMPRESSION: No acute abnormality noted.   Electronically Signed   By: Alcide Clever M.D.   On: 03/02/2014 21:05   Ct Head Wo Contrast  03/02/2014   CLINICAL DATA:  Double vision for 1 day.  Altered mental status.  EXAM: CT HEAD WITHOUT CONTRAST  TECHNIQUE: Contiguous axial images were obtained from the base of the skull through the vertex without intravenous contrast.  COMPARISON:  CT HEAD W/O CM dated 04/02/2013  FINDINGS: Diffuse cerebral atrophy. Mild ventricular dilatation consistent with central atrophy. Low-attenuation changes throughout the deep white matter consistent with small vessel ischemia. No significant changes since prior study. No mass effect or midline shift. No abnormal extra-axial fluid collections. Gray-white matter junctions are distinct. Basal cisterns are not effaced. No evidence of acute intracranial hemorrhage. No depressed skull fractures. Visualized paranasal sinuses and mastoid air cells are not opacified.  IMPRESSION: No acute intracranial abnormalities. Chronic atrophy and small vessel ischemic changes.   Electronically Signed   By: Burman Nieves M.D.   On: 03/02/2014 21:27     EKG Interpretation None      Date: 03/02/2014  Rate: 80  Rhythm: normal sinus rhythm  QRS Axis: normal  Intervals: normal  ST/T Wave abnormalities: normal  Conduction Disutrbances:none  Narrative Interpretation:   Old EKG Reviewed: none available       MDM   Final diagnoses:  Confusion  Hallucinations    Patient brought in by her daughter.  Patient lives by herself. Patient has been seen in hearing things are probably for the past 2 days. Patient going to the bathroom a lot. Stents the workup here without any evidence urinary tract infection head CT negative chest x-ray negative for pneumonia pulmonary edema. Labs without significant abnormalities no leukocytosis no anemia. No renal insufficiency. No  significant electrolyte abnormalities. As stated no urinary tract infection. EKG without any acute arrhythmias. Patient as per family will require admission since she lives by herself. X-rays were also done of her ankle where there was some lateral swelling of the left ankle but no evidence of any injury possibly could be a sprain air from an unknown fall. Patient was also complaining of back pains a slight lumbar x-rays were done they are negative for any acute abnormalities. Patient is followed by [facility]. We'll discuss with hospitalist regarding admission.    Shelda Jakes, MD 03/02/14 2138

## 2014-03-03 ENCOUNTER — Encounter (HOSPITAL_COMMUNITY): Payer: Self-pay | Admitting: *Deleted

## 2014-03-03 DIAGNOSIS — F3289 Other specified depressive episodes: Secondary | ICD-10-CM

## 2014-03-03 DIAGNOSIS — R443 Hallucinations, unspecified: Secondary | ICD-10-CM

## 2014-03-03 DIAGNOSIS — F329 Major depressive disorder, single episode, unspecified: Secondary | ICD-10-CM

## 2014-03-03 LAB — TSH: TSH: 1.69 u[IU]/mL (ref 0.350–4.500)

## 2014-03-03 LAB — VITAMIN B12: Vitamin B-12: 221 pg/mL (ref 211–911)

## 2014-03-03 LAB — RPR

## 2014-03-03 MED ORDER — LISINOPRIL 10 MG PO TABS
20.0000 mg | ORAL_TABLET | Freq: Every day | ORAL | Status: DC
  Administered 2014-03-03: 20 mg via ORAL
  Filled 2014-03-03: qty 2

## 2014-03-03 MED ORDER — HYDROCHLOROTHIAZIDE 25 MG PO TABS
25.0000 mg | ORAL_TABLET | Freq: Every day | ORAL | Status: DC
  Administered 2014-03-03: 25 mg via ORAL
  Filled 2014-03-03: qty 1

## 2014-03-03 MED ORDER — LISINOPRIL-HYDROCHLOROTHIAZIDE 20-25 MG PO TABS: 1.0000 | ORAL_TABLET | Freq: Every day | ORAL | Status: DC

## 2014-03-03 MED ORDER — ENOXAPARIN SODIUM 40 MG/0.4ML ~~LOC~~ SOLN
40.0000 mg | SUBCUTANEOUS | Status: DC
  Administered 2014-03-03: 40 mg via SUBCUTANEOUS
  Filled 2014-03-03: qty 0.4

## 2014-03-03 MED ORDER — LORATADINE 10 MG PO TABS
10.0000 mg | ORAL_TABLET | Freq: Every day | ORAL | Status: DC
  Filled 2014-03-03 (×2): qty 1

## 2014-03-03 MED ORDER — CITALOPRAM HYDROBROMIDE 20 MG PO TABS
20.0000 mg | ORAL_TABLET | Freq: Every day | ORAL | Status: DC
  Administered 2014-03-03: 20 mg via ORAL
  Filled 2014-03-03: qty 1

## 2014-03-03 NOTE — Progress Notes (Signed)
Patient being d/c home with daughter. Verbalizes understanding on instructions. IV cath removed and intact. No pain.swelling at site. No c/o pain at this time. Family at bedside.

## 2014-03-03 NOTE — Care Management Note (Signed)
    Page 1 of 1   03/03/2014     1:26:27 PM CARE MANAGEMENT NOTE 03/03/2014  Patient:  Sherry Fox, Sherry Fox   Account Number:  [id number]  Date Initiated:  03/03/2014  Documentation initiated by:  Rosemary Holms  Subjective/Objective Assessment:   Pt admitted from home where she lives alone. Pt having visual hallucinations. Pt and daughter state that they could benefit from [facility] RN.     Action/Plan:   Anticipated DC Date:  03/03/2014   Anticipated DC Plan:  HOME W HOME HEALTH SERVICES      DC Planning Services  CM consult      Choice offered to / List presented to:             Status of service:  Completed, signed off Medicare Important Message given?   (If response is "NO", the following Medicare IM given date fields will be blank) Date Medicare IM given:   Date Additional Medicare IM given:    Discharge Disposition:  HOME W HOME HEALTH SERVICES  Per UR Regulation:    If discussed at Long Length of Stay Meetings, dates discussed:    Comments:  03/03/14 Rosemary Holms RN BSN CM

## 2014-03-03 NOTE — Progress Notes (Signed)
Utilization Review Complete  

## 2014-03-03 NOTE — Discharge Summary (Signed)
Physician Discharge Summary  Sherry Fox VVO:[id number] DOB: Oct 12, 1930 DOA: 03/02/2014  PCP: Sherry Heap, MD  Admit date: 03/02/2014 Discharge date: 03/03/2014  Recommendations for Outpatient Follow-up:  1. Hallucinations, now resolved. Suspect benign etiology, see discussion below. If recurs could consider outpatient neurology evaluation.  2. TSH, B12, RPR pending at time of discharge   Follow-up Information   Follow up with Sherry Heap, MD. Schedule an appointment as soon as possible for a visit in 1 week.   Specialty:  Family Medicine   Contact information:   [address] [place] [address] [phone]      Discharge Diagnoses:  1. Visual hallucinations, suspect hypnagogic 2. Parkinson's disease 3. Depression, anxiety  Discharge Condition: Improved Disposition: Home  Diet recommendation: Regular  Filed Weights   03/02/14 1807 03/02/14 2359  Weight: 81.647 kg (180 lb) 82.283 kg (181 lb 6.4 oz)    History of present illness:  80 year old woman presented with a two-day history of hallucinations by report, seemed to be associated with sleep. Initial investigation was unrevealing the patient was noted to be alert and oriented on admission. Observation requested.  Hospital Course:  Patient was observed overnight. She had no recurrent hallucinations. Extensive workup on admission was unremarkable. On further review and discussion with the patient, hallucinations occur after falling asleep and consisted of seeing a figure at her window. No hallucinations during the day. Daughter reports the patient currently at baseline and has no significant memory problems. She also reports that utility work was done around the patient's house last week and multiple strangers were on the property. Onset of symptoms coincides with this. In the absence of infection, metabolic abnormality or CNS event, favor benign etiology, would expect spontaneous resolution. Daughter reports  additionally the hallucinations do not occur if the family members there. Urology consultation is contemplated, however the patient wishes to be discharged home and this could be pursued in the outpatient setting if clinically indicated.  1. Acute encephalopathy, visual and auditory hallucinations. No new medications, no narcotics. Etiology unclear no evidence of infection, CNS event or metabolic derangement. 2. History of Parkinson's disease 3. Depression, anxiety. Appears stable. No evidence of recurrent hallucinations. Exam unremarkable and workup unremarkable. Discussed with daughter and patien. Patient wants to go home, daughter agrees. Overall she has been doing well at home without falls. Daughter plans to work on having someone stay with her at night.  I do not think neurologic consultation as an inpatient would add to her current care and this could be pursued as an outpatient if clinically indicated.  Discussed in detail with daughter at bedside..  Followup TSH, B12, RPR in the outpatient setting   Consultants:  none Procedures: none  Discharge Instructions  Discharge Instructions   Activity as tolerated - No restrictions    Complete by:  As directed      Diet general    Complete by:  As directed      Discharge instructions    Complete by:  As directed   Call physician or seek immediate medical attention for worsening of condition, hallucinations or confusion.            Medication List         cetirizine 10 MG tablet  Commonly known as:  ZYRTEC  Take 1 tablet (10 mg total) by mouth daily.     citalopram 20 MG tablet  Commonly known as:  CELEXA  Take 20 mg by mouth daily as needed. anxiety  ibuprofen 200 MG tablet  Commonly known as:  ADVIL,MOTRIN  Take 200 mg by mouth every 6 (six) hours as needed for pain. pain     lisinopril-hydrochlorothiazide 20-25 MG per tablet  Commonly known as:  PRINZIDE,ZESTORETIC  Take 1 tablet by mouth daily.     SYSTANE 0.4-0.3  % Soln  Generic drug:  Polyethyl Glycol-Propyl Glycol  Apply 1 drop to eye daily as needed (for dry eye relief).       Allergies  Allergen Reactions  . Meloxicam Rash and Other (See Comments)    Reaction is unknown    The results of significant diagnostics from this hospitalization (including imaging, microbiology, ancillary and laboratory) are listed below for reference.    Significant Diagnostic Studies: Dg Chest 2 View  03/02/2014   CLINICAL DATA:  Back pain  EXAM: CHEST  2 VIEW  COMPARISON:  06/02/2011  FINDINGS: The heart size and mediastinal contours are within normal limits. Both lungs are clear. The visualized skeletal structures are unremarkable.  IMPRESSION: No active cardiopulmonary disease.   Electronically Signed   By: Alcide Clever M.D.   On: 03/02/2014 21:07   Dg Lumbar Spine Complete  03/02/2014   CLINICAL DATA:  Low back pain following recent injury  EXAM: LUMBAR SPINE - COMPLETE 4+ VIEW  COMPARISON:  None.  FINDINGS: Five lumbar type vertebral bodies are well visualized. Vertebral body height is well maintained. Disc space narrowing is noted at all lumbar levels with associated osteophytic changes. No acute fractures noted.  IMPRESSION: Degenerative change without acute abnormality.   Electronically Signed   By: Alcide Clever M.D.   On: 03/02/2014 21:06   Dg Ankle Complete Left  03/02/2014   CLINICAL DATA:  Left ankle pain  EXAM: LEFT ANKLE COMPLETE - 3+ VIEW  COMPARISON:  None.  FINDINGS: There is no evidence of fracture, dislocation, or joint effusion. There is no evidence of arthropathy or other focal bone abnormality. Soft tissues are unremarkable.  IMPRESSION: No acute abnormality noted.   Electronically Signed   By: Alcide Clever M.D.   On: 03/02/2014 21:05   Ct Head Wo Contrast  03/02/2014   CLINICAL DATA:  Double vision for 1 day.  Altered mental status.  EXAM: CT HEAD WITHOUT CONTRAST  TECHNIQUE: Contiguous axial images were obtained from the base of the skull through  the vertex without intravenous contrast.  COMPARISON:  CT HEAD W/O CM dated 04/02/2013  FINDINGS: Diffuse cerebral atrophy. Mild ventricular dilatation consistent with central atrophy. Low-attenuation changes throughout the deep white matter consistent with small vessel ischemia. No significant changes since prior study. No mass effect or midline shift. No abnormal extra-axial fluid collections. Gray-white matter junctions are distinct. Basal cisterns are not effaced. No evidence of acute intracranial hemorrhage. No depressed skull fractures. Visualized paranasal sinuses and mastoid air cells are not opacified.  IMPRESSION: No acute intracranial abnormalities. Chronic atrophy and small vessel ischemic changes.   Electronically Signed   By: Burman Nieves M.D.   On: 03/02/2014 21:27     Labs: Basic Metabolic Panel:  Recent Labs Lab 03/02/14 1835  NA 142  K 3.8  CL 103  CO2 30  GLUCOSE 134*  BUN 20  CREATININE 1.00  CALCIUM 9.2   Liver Function Tests:  Recent Labs Lab 03/02/14 1835  AST 20  ALT 12  ALKPHOS 87  BILITOT 0.4  PROT 7.3  ALBUMIN 3.6    Recent Labs Lab 03/02/14 1835  LIPASE 13   CBC:  Recent Labs  Lab 03/02/14 1835  WBC 5.0  NEUTROABS 3.0  HGB 12.9  HCT 39.4  MCV 89.7  PLT 180     Recent Labs  03/02/14 1835  PROBNP 302.2    Principal Problem:   Hypnagogic hallucinations Active Problems:   Essential hypertension, benign   Morbid obesity   Altered mental status   Depression   Time coordinating discharge: 35 minutes  Signed:  Brendia Sacksaniel Goodrich, MD [facility] 03/03/2014, 1:16 PM

## 2014-03-03 NOTE — Progress Notes (Signed)
PROGRESS NOTE  NURY GROSKOPF VJK:[id number] DOB: 02/20/1930 DOA: 03/02/2014 PCP: Rudi Heap, MD  Summary: 80 year old woman presented with a two-day history of hallucinations by report, seemed to be associated with sleep. Initial investigation was unrevealing the patient was noted to be alert and oriented on admission.  Assessment/Plan: 1. Acute encephalopathy, visual and auditory hallucinations. No new medications, no narcotics. Etiology unclear no evidence of infection, CNS event or metabolic derangement. 2. History of Parkinson's disease 3. Depression, anxiety. Appears stable.   Daughter reports over the last week there has been some utility work done around the patient's house with multiple strangers on the property. Since that time the patient has had intermittent hallucinations after falling asleep, seeing a figure out the window.  No evidence of recurrent hallucinations. Exam unremarkable and workup unremarkable. Discussed with daughter and patient was sent. Patient wants to go home, daughter agrees. Overall she has been doing well at home without falls. Daughter plans to work on having someone stay with her at night.  I do not think neurologic consultation as an inpatient would add to her current care and this could be pursued as an outpatient if clinically indicated.  Discussed in detail with daughter at bedside..  Followup TSH, B12, RPR in the outpatient setting   Brendia Sacks, MD  [facility]  Pager [phone] If 7PM-7AM, please contact night-coverage at [email or web address], password [facility] 03/03/2014, 12:10 PM  LOS: 1 day   Consultants:  Neurology  Procedures:    Antibiotics:    HPI/Subjective: No issues overnight. No hallucinations last night or this morning. No pain. No paresthesias. No focal neurologic deficits. Tolerating diet. No confusion.  Objective: Filed Vitals:   03/02/14 2200 03/02/14 2230 03/02/14 2359 03/03/14 0501  BP: 133/76 142/70 155/82  127/71  Pulse: 85 78 82 71  Temp:   98 F (36.7 C) 98.1 F (36.7 C)  TempSrc:   Oral Oral  Resp: 18     Height:   5\' 3"  (1.6 m)   Weight:   82.283 kg (181 lb 6.4 oz)   SpO2: 97% 96% 96% 94%    Intake/Output Summary (Last 24 hours) at 03/03/14 1210 Last data filed at 03/03/14 1127  Gross per 24 hour  Intake    300 ml  Output    703 ml  Net   -403 ml     Filed Weights   03/02/14 1807 03/02/14 2359  Weight: 81.647 kg (180 lb) 82.283 kg (181 lb 6.4 oz)    Exam:   Afebrile, vital signs are stable. No hypoxia.  Gen. Appears calm and comfortable.  Psychiatric. Grossly normal mood and affect. Speech fluent and appropriate.  Neurologic. Cranial nerves 2-12 intact. No upper extremity dysdiadochokinesis. No pronator drift. Tone and strength in the upper and lower extremities is grossly normal and symmetric.  Cardiovascular. Regular rate and rhythm. No murmur, rub or gallop. No lower extremity edema.  Respiratory. Clear to auscultation bilaterally. No wheezes, rales or rhonchi. Normal respiratory effort.  Abdomen soft nontender and nondistended.  Data Reviewed:  On admission: Complete metabolic panel unremarkable, CBC normal, urinalysis negative.  CT head no acute abnormalities. Chest x-ray, lumbar spine film, ankle film unremarkable.  Scheduled Meds: . citalopram  20 mg Oral Daily  . enoxaparin (LOVENOX) injection  40 mg Subcutaneous Q24H  . lisinopril  20 mg Oral Daily   And  . hydrochlorothiazide  25 mg Oral Daily  . loratadine  10 mg Oral Daily   Continuous Infusions: . sodium chloride 50  mL/hr at 03/03/14 0023    Principal Problem:   Hypnagogic hallucinations Active Problems:   Essential hypertension, benign   Morbid obesity   Altered mental status   Depression

## 2014-03-04 LAB — FOLATE RBC: RBC FOLATE: 625 ng/mL — AB (ref >280)

## 2014-03-06 ENCOUNTER — Ambulatory Visit (INDEPENDENT_AMBULATORY_CARE_PROVIDER_SITE_OTHER): Payer: Medicare Other | Admitting: Family Medicine

## 2014-03-06 ENCOUNTER — Encounter: Payer: Self-pay | Admitting: Family Medicine

## 2014-03-06 VITALS — BP 126/66 | HR 89 | Temp 97.7°F | Ht 62.0 in | Wt 180.0 lb

## 2014-03-06 DIAGNOSIS — Z111 Encounter for screening for respiratory tuberculosis: Secondary | ICD-10-CM

## 2014-03-06 DIAGNOSIS — Z23 Encounter for immunization: Secondary | ICD-10-CM

## 2014-03-06 DIAGNOSIS — R443 Hallucinations, unspecified: Secondary | ICD-10-CM

## 2014-03-06 MED ORDER — RISPERIDONE 0.25 MG PO TABS: 0.2500 mg | ORAL_TABLET | Freq: Every day | ORAL | Status: DC

## 2014-03-06 NOTE — Progress Notes (Signed)
   Subjective:    Patient ID: Sherry Fox, female    DOB: [birth year], 80 y.o.   MRN: [id number]  HPI  This 80 y.o. female presents for evaluation of follow up from recent hospitalization.  She has been having hallucinations at her home. She was dx with hypnogogic hallucinations.  She is still having hallucinations after dc from hospital.  She is accompanied by her daughter who states this started a week ago at her home after some workers from [employer] came by and worked on her house.  She Has been seeing people outside her house sitting and outside her window.  She has these hallucinations when she is awake according to her daughter.  She was dc'd from the hospital Friday 03/03/14.  Her daughter states she calls her at night.  Her daughter states she is not having hallucinations when away from her home.  She called her last night stating she is seeing people outside her window.  Patient states she does hear voices.  Review of Systems C/o auditory and visual hallucinations No chest pain, SOB, HA, dizziness, vision change, N/V, diarrhea, constipation, dysuria, urinary urgency or frequency, myalgias, arthralgias or rash.     Objective:   Physical Exam Vital signs noted  Well developed well nourished elderly female.  HEENT - Head atraumatic Normocephalic                Eyes - PERRLA, Conjuctiva - clear Sclera- Clear EOMI                Ears - EAC's Wnl TM's Wnl Gross Hearing WNL                Throat - oropharanx wnl Respiratory - Lungs CTA bilateral Cardiac - RRR S1 and S2 without murmur GI - Abdomen soft Nontender and bowel sounds active x 4 Extremities - No edema. Neuro - Grossly intact.       Assessment & Plan:  Hallucinations - Plan: risperiDONE (RISPERDAL) 0.25 MG tablet May be due to dementia and recommend neurology referral if continues.  She is going to  Be placed in assisted living by daughter and needs TBST today.  Follow up in one week  Deatra Canter FNP

## 2014-03-06 NOTE — Addendum Note (Signed)
Addended by: Bearl Mulberry on: 03/06/2014 01:11 PM   Modules accepted: Orders

## 2014-03-10 ENCOUNTER — Telehealth: Payer: Self-pay | Admitting: Family Medicine

## 2014-03-10 NOTE — Telephone Encounter (Signed)
Appt given per request

## 2014-03-14 ENCOUNTER — Ambulatory Visit (INDEPENDENT_AMBULATORY_CARE_PROVIDER_SITE_OTHER): Payer: Medicare Other | Admitting: Family Medicine

## 2014-03-14 ENCOUNTER — Encounter: Payer: Self-pay | Admitting: Family Medicine

## 2014-03-14 VITALS — BP 115/59 | HR 88 | Temp 97.6°F | Ht 62.0 in | Wt 183.0 lb

## 2014-03-14 DIAGNOSIS — Z111 Encounter for screening for respiratory tuberculosis: Secondary | ICD-10-CM

## 2014-03-14 DIAGNOSIS — F0390 Unspecified dementia without behavioral disturbance: Secondary | ICD-10-CM

## 2014-03-14 NOTE — Progress Notes (Signed)
   Subjective:    Patient ID: Sherry Fox, female    DOB: 1929/12/03, 80 y.o.   MRN: [id number]  HPI  This 80 y.o. female presents for evaluation of problems with hallucinations while she is at home. She is no longer having these when at her daughters house but when she goes home she sees people in her house.  Her daughter is placing her in [facility] and she needs the [facility] form filled out  Review of Systems    No chest pain, SOB, HA, dizziness, vision change, N/V, diarrhea, constipation, dysuria, urinary urgency or frequency, myalgias, arthralgias or rash.  Objective:   Physical Exam  Vital signs noted  Well developed well nourished female.  HEENT - Head atraumatic Normocephalic                Eyes - PERRLA, Conjuctiva - clear Sclera- Clear EOMI                Ears - EAC's Wnl TM's Wnl Gross Hearing WNL                Nose - Nares patent                 Throat - oropharanx wnl Respiratory - Lungs CTA bilateral Cardiac - RRR S1 and S2 without murmur GI - Abdomen soft Nontender and bowel sounds active x 4 Extremities - No edema. Neuro - Grossly intact.      Assessment & Plan:  PPD screening test - Plan: PPD  Dementia - Continue celexa and risperdal  Follow up in 3 months  Deatra Canter FNP

## 2014-03-16 LAB — TB SKIN TEST
Induration: 0 mm
TB Skin Test: NEGATIVE

## 2014-03-31 ENCOUNTER — Encounter: Payer: Self-pay | Admitting: Family Medicine

## 2014-03-31 ENCOUNTER — Ambulatory Visit (INDEPENDENT_AMBULATORY_CARE_PROVIDER_SITE_OTHER): Payer: Medicare Other | Admitting: Family Medicine

## 2014-03-31 VITALS — BP 98/60 | HR 79 | Temp 96.7°F | Ht 62.0 in | Wt 184.8 lb

## 2014-03-31 DIAGNOSIS — W19XXXA Unspecified fall, initial encounter: Secondary | ICD-10-CM

## 2014-03-31 DIAGNOSIS — R269 Unspecified abnormalities of gait and mobility: Secondary | ICD-10-CM

## 2014-03-31 DIAGNOSIS — R350 Frequency of micturition: Secondary | ICD-10-CM

## 2014-03-31 LAB — POCT URINALYSIS DIPSTICK
Bilirubin, UA: NEGATIVE
Blood, UA: NEGATIVE
Glucose, UA: NEGATIVE
Ketones, UA: NEGATIVE
Leukocytes, UA: NEGATIVE
Nitrite, UA: NEGATIVE
Protein, UA: NEGATIVE
Spec Grav, UA: 1.02
Urobilinogen, UA: NEGATIVE
pH, UA: 5

## 2014-03-31 LAB — POCT UA - MICROSCOPIC ONLY

## 2014-03-31 MED ORDER — MIRABEGRON ER 25 MG PO TB24: 25.0000 mg | ORAL_TABLET | Freq: Every day | ORAL | Status: DC

## 2014-03-31 NOTE — Progress Notes (Signed)
   Subjective:    Patient ID: MAO HAMON, female    DOB: [birth year], 81 y.o.   MRN: [id number]  HPI This 80 y.o. female presents for evaluation of falls.  She is staying in assisted living and she Is doing better and not having hallucinations but she is falling and she recently fell and has abrasions On her left arm.  She is also having urinary frequency which states is not new.  She has to get up every 2 hours when she is sleeping to void.   Review of Systems C/o falls and urinary frequency No chest pain, SOB, HA, dizziness, vision change, N/V, diarrhea, constipation, myalgias, arthralgias or rash.     Objective:   Physical Exam  Vital signs noted  Well developed elderly female in NAD.  HEENT - Head atraumatic Normocephalic                Eyes - PERRLA, Conjuctiva - clear Sclera- Clear EOMI                Throat - oropharanx wnl Respiratory - Lungs CTA bilateral Cardiac - RRR S1 and S2 without murmur Skin - Abrasion left arm healing well. Gait - Unstable with shuffling and does not pick feet up very well.      Assessment & Plan:  Falls - Plan: For home use only DME 4 wheeled rolling walker with seat ([id number]), Ambulatory referral to Physical Therapy.  She has unstable gait and can use a walker which will improve her Mobility.  Urinary frequency - Myrbetriq 25 mg po qd #30 w/11.  Check UA dipstick and micro  Deatra Canter FNP

## 2014-04-03 ENCOUNTER — Telehealth: Payer: Self-pay | Admitting: Family Medicine

## 2014-05-23 ENCOUNTER — Telehealth: Payer: Self-pay | Admitting: Family Medicine

## 2014-05-23 NOTE — Telephone Encounter (Signed)
appt given for wed 12 with bill

## 2014-05-31 ENCOUNTER — Encounter: Payer: Self-pay | Admitting: Family Medicine

## 2014-05-31 ENCOUNTER — Ambulatory Visit (INDEPENDENT_AMBULATORY_CARE_PROVIDER_SITE_OTHER): Payer: Medicare Other | Admitting: Family Medicine

## 2014-05-31 VITALS — BP 125/72 | HR 72 | Temp 97.3°F | Ht 62.0 in | Wt 194.8 lb

## 2014-05-31 DIAGNOSIS — N318 Other neuromuscular dysfunction of bladder: Secondary | ICD-10-CM

## 2014-05-31 DIAGNOSIS — R609 Edema, unspecified: Secondary | ICD-10-CM

## 2014-05-31 DIAGNOSIS — E538 Deficiency of other specified B group vitamins: Secondary | ICD-10-CM

## 2014-05-31 DIAGNOSIS — I10 Essential (primary) hypertension: Secondary | ICD-10-CM

## 2014-05-31 DIAGNOSIS — R350 Frequency of micturition: Secondary | ICD-10-CM

## 2014-05-31 DIAGNOSIS — H109 Unspecified conjunctivitis: Secondary | ICD-10-CM

## 2014-05-31 DIAGNOSIS — N3281 Overactive bladder: Secondary | ICD-10-CM

## 2014-05-31 LAB — POCT CBC
Granulocyte percent: 60.2 %G (ref 37–80)
HCT, POC: 36.6 % — AB (ref 37.7–47.9)
Hemoglobin: 11.9 g/dL — AB (ref 12.2–16.2)
Lymph, poc: 1.6 (ref 0.6–3.4)
MCH, POC: 28.6 pg (ref 27–31.2)
MCHC: 32.5 g/dL (ref 31.8–35.4)
MCV: 88.1 fL (ref 80–97)
MPV: 8.1 fL (ref 0–99.8)
POC Granulocyte: 2.9 (ref 2–6.9)
POC LYMPH PERCENT: 33 %L (ref 10–50)
Platelet Count, POC: 160 10*3/uL (ref 142–424)
RBC: 4.2 M/uL (ref 4.04–5.48)
RDW, POC: 14.1 %
WBC: 4.8 10*3/uL (ref 4.6–10.2)

## 2014-05-31 LAB — POCT URINALYSIS DIPSTICK
Bilirubin, UA: NEGATIVE
Blood, UA: NEGATIVE
Glucose, UA: NEGATIVE
Ketones, UA: NEGATIVE
Leukocytes, UA: NEGATIVE
Nitrite, UA: NEGATIVE
Protein, UA: NEGATIVE
Spec Grav, UA: 1.015
Urobilinogen, UA: NEGATIVE
pH, UA: 6

## 2014-05-31 LAB — POCT UA - MICROSCOPIC ONLY
Casts, Ur, LPF, POC: NEGATIVE
Crystals, Ur, HPF, POC: NEGATIVE
Mucus, UA: NEGATIVE
RBC, urine, microscopic: NEGATIVE
Yeast, UA: NEGATIVE

## 2014-05-31 MED ORDER — POLYMYXIN B-TRIMETHOPRIM 10000-0.1 UNIT/ML-% OP SOLN: 1.0000 [drp] | OPHTHALMIC | Status: DC

## 2014-05-31 MED ORDER — MIRABEGRON ER 25 MG PO TB24: 25.0000 mg | ORAL_TABLET | Freq: Every day | ORAL | Status: DC

## 2014-05-31 MED ORDER — CYANOCOBALAMIN 1000 MCG/ML IJ SOLN
1000.0000 ug | INTRAMUSCULAR | Status: DC
  Administered 2014-05-31 – 2014-12-28 (×3): 1000 ug via INTRAMUSCULAR

## 2014-05-31 MED ORDER — CYANOCOBALAMIN 1000 MCG/ML IJ SOLN: INTRAMUSCULAR | Status: DC

## 2014-05-31 NOTE — Progress Notes (Signed)
   Subjective:    Patient ID: Sherry Fox, female    DOB: 05-13-1930, 80 y.o.   MRN: [id number]  HPI This 80 y.o. female presents for evaluation of routine follow up. She has been having some OAB sx's and she has not been taking the myrbetriq so we will send her rx's with her to have the SNF get the rx's and can administer.  She has chronic dry eyes and she is having tearing of her eyes.  She had low b12 in the hospital.  She has fatigue.  She has been doing better with sleep and is not having hallucinations since starting the risperdal.  She has hx of hypertension and she has hx of dementia. She is accompanied by daughter.   Review of Systems C/o irritation in her eyes.   No chest pain, SOB, HA, dizziness, vision change, N/V, diarrhea, constipation, dysuria, urinary urgency or frequency, myalgias, arthralgias or rash.     Objective:   Physical Exam  Vital signs noted  Elderly female in NAD.  HEENT - Head atraumatic Normocephalic                Eyes - PERRLA, Conjuctiva - injected ou Sclera- Clear EOMI                Ears - EAC's Wnl TM's Wnl Gross Hearing WNL                Throat - oropharanx wnl Respiratory - Lungs CTA bilateral Cardiac - RRR S1 and S2 without murmur GI - Abdomen soft Nontender and bowel sounds active x 4 Extremities - bilateral general edema in feet and legs bilateral      Assessment & Plan:  B12 deficiency - Plan: cyanocobalamin ((VITAMIN B-12)) injection 1,000 mcg, cyanocobalamin (,VITAMIN B-12,) 1000 MCG/ML injection, DISCONTINUED: cyanocobalamin (,VITAMIN B-12,) 1000 MCG/ML injection  Unspecified essential hypertension - Plan: POCT CBC, CMP14+EGFR  Edema - Plan: POCT UA - Microscopic Only, POCT urinalysis dipstick  Urinary frequency - Plan: mirabegron ER (MYRBETRIQ) 25 MG TB24 tablet  OAB (overactive bladder) - Plan: mirabegron ER (MYRBETRIQ) 25 MG TB24 tablet  Bilateral conjunctivitis - Plan: trimethoprim-polymyxin b (POLYTRIM) ophthalmic solution,  DISCONTINUED: trimethoprim-polymyxin b (POLYTRIM) ophthalmic solution  Follow up in 3 months.  [place]

## 2014-06-01 LAB — CMP14+EGFR
ALT: 10 IU/L (ref 0–32)
AST: 17 IU/L (ref 0–40)
Albumin/Globulin Ratio: 1.6 (ref 1.1–2.5)
Albumin: 4.1 g/dL (ref 3.5–4.7)
Alkaline Phosphatase: 84 IU/L (ref 39–117)
BUN/Creatinine Ratio: 27 — ABNORMAL HIGH (ref 11–26)
BUN: 30 mg/dL — ABNORMAL HIGH (ref 8–27)
CO2: 26 mmol/L (ref 18–29)
Calcium: 9.5 mg/dL (ref 8.7–10.3)
Chloride: 99 mmol/L (ref 97–108)
Creatinine, Ser: 1.11 mg/dL — ABNORMAL HIGH (ref 0.57–1.00)
GFR calc Af Amer: 53 mL/min/{1.73_m2} — ABNORMAL LOW (ref >59)
GFR calc non Af Amer: 46 mL/min/{1.73_m2} — ABNORMAL LOW (ref >59)
Globulin, Total: 2.6 g/dL (ref 1.5–4.5)
Glucose: 89 mg/dL (ref 65–99)
Potassium: 4.7 mmol/L (ref 3.5–5.2)
Sodium: 139 mmol/L (ref 134–144)
Total Bilirubin: 0.3 mg/dL (ref 0.0–1.2)
Total Protein: 6.7 g/dL (ref 6.0–8.5)

## 2014-06-02 ENCOUNTER — Telehealth: Payer: Self-pay | Admitting: Family Medicine

## 2014-06-02 NOTE — Telephone Encounter (Signed)
Message copied by Azalee Course on Fri Jun 02, 2014 10:29 AM ------      Message from: Deatra Canter      Created: Thu Jun 01, 2014  5:05 PM       Lab ok ------

## 2014-06-12 ENCOUNTER — Telehealth: Payer: Self-pay | Admitting: Family Medicine

## 2014-06-20 NOTE — Telephone Encounter (Signed)
I spoke with Sherry Fox at [facility] and letter faxed over with dates of TB skin test

## 2014-06-27 ENCOUNTER — Telehealth: Payer: Self-pay | Admitting: Family Medicine

## 2014-06-27 NOTE — Telephone Encounter (Signed)
She can wear the compression stocking prn and will have rx for them to p/u

## 2014-06-28 NOTE — Telephone Encounter (Signed)
lmtcb

## 2014-06-28 NOTE — Telephone Encounter (Signed)
The prescription to DC the stockings has been written and just needs to be picked up.

## 2014-06-28 NOTE — Telephone Encounter (Signed)
Patient does not want them any more can she stop them all together?

## 2014-07-03 ENCOUNTER — Telehealth: Payer: Self-pay | Admitting: Family Medicine

## 2014-07-03 NOTE — Telephone Encounter (Signed)
Patient care taker aware if [facility] has someone that can give then it is ok for them to give.

## 2014-07-05 ENCOUNTER — Ambulatory Visit (INDEPENDENT_AMBULATORY_CARE_PROVIDER_SITE_OTHER): Payer: Medicare Other | Admitting: *Deleted

## 2014-07-05 DIAGNOSIS — E538 Deficiency of other specified B group vitamins: Secondary | ICD-10-CM

## 2014-07-05 NOTE — Patient Instructions (Signed)
Vitamin B12 Injections Every person needs vitamin B12. A deficiency develops when the body does not get enough of it. One way to overcome this is by getting B12 shots (injections). A B12 shot puts the vitamin directly into muscle tissue. This avoids any problems your body might have in absorbing it from food or a pill. In some people, the body has trouble using the vitamin correctly. This can cause a B12 deficiency. Not consuming enough of the vitamin can also cause a deficiency. Getting enough vitamin B12 can be hard for elderly people. Sometimes, they do not eat a well-balanced diet. The elderly are also more likely than younger people to have medical conditions or take medications that can lead to a deficiency. WHAT DOES VITAMIN B12 DO? Vitamin B12 does many things to help the body work right:  It helps the body make healthy red blood cells.  It helps maintain nerve cells.  It is involved in the body's process of converting food into energy (metabolism).  It is needed to make the genetic material in all cells (DNA). VITAMIN B12 FOOD SOURCES Most people get plenty of vitamin B12 through the foods they eat. It is present in:  Meat, fish, poultry, and eggs.  Milk and milk products.  It also is added when certain foods are made, including some breads, cereals and yogurts. The food is then called "fortified". CAUSES The most common causes of vitamin B12 deficiency are:  Pernicious anemia. The condition develops when the body cannot make enough healthy red blood cells. This stems from a lack of a protein made in the stomach (intrinsic factor). People without this protein cannot absorb enough vitamin B12 from food.  Malabsorption. This is when the body cannot absorb the vitamin. It can be caused by:  Pernicious anemia.  Surgery to remove part or all of the stomach can lead to malabsorption. Removal of part or all of the small intestine can also cause malabsorption.  Vegetarian diet.  People who are strict about not eating foods from animals could have trouble taking in enough vitamin B12 from diet alone.  Medications. Some medicines have been linked to B12 deficiency, such as Metformin (a drug prescribed for type 2 diabetes). Long-term use of stomach acid suppressants also can keep the vitamin from being absorbed.  Intestinal problems such as inflammatory bowel disease. If there are problems in the digestive tract, vitamin B12 may not be absorbed in good enough amounts. SYMPTOMS People who do not get enough B12 can develop problems. These can include:  Anemia. This is when the body has too few red blood cells. Red blood cells carry oxygen to the rest of the body. Without a healthy supply of red blood cells, people can feel:  Tired (fatigued).  Weak.  Severe anemia can cause:  Shortness of breath.  Dizziness.  Rapid heart rate.  Paleness.  Other Vitamin B12 deficiency symptoms include:  Diarrhea.  Numbness or tingling in the hands or feet.  Loss of appetite.  Confusion.  Sores on the tongue or in the mouth. LET YOUR CAREGIVER KNOW ABOUT:  Any allergies. It is very important to know if you are allergic or sensitive to cobalt. Vitamin B12 contains cobalt.  Any history of kidney disease.  All medications you are taking. Include prescription and over-the-counter medicines, herbs and creams.  Whether you are pregnant or breast-feeding.  If you have Leber's disease, a hereditary eye condition, vitamin B12 could make it worse. RISKS AND COMPLICATIONS Reactions to an injection are   usually temporary. They might include:  Pain at the injection site.  Redness, swelling or tenderness at the site.  Headache, dizziness or weakness.  Nausea, upset stomach or diarrhea.  Numbness or tingling.  Fever.  Joint pain.  Itching or rash. If a reaction does not go away in a short while, talk with your healthcare provider. A change in the way the shots are  given, or where they are given, might need to be made. BEFORE AN INJECTION To decide whether B12 injections are right for you, your healthcare provider will probably:  Ask about your medical history.  Ask questions about your diet.  Ask about symptoms such as:  Have you felt weak?  Do you feel unusually tired?  Do you get dizzy?  Order blood tests. These may include a test to:  Check the level of red cells in your blood.  Measure B12 levels.  Check for the presence of intrinsic factor. VITAMIN B12 INJECTIONS How often you will need a vitamin B12 injection will depend on how severe your deficiency is. This also will affect how long you will need to get them. People with pernicious anemia usually get injections for their entire life. Others might get them for a shorter period. For many people, injections are given daily or weekly for several weeks. Then, once B12 levels are normal, injections are given just once a month. If the cause of the deficiency can be fixed, the injections can be stopped. Talk with your healthcare provider about what you should expect. For an injection:  The injection site will be cleaned with an alcohol swab.  Your healthcare provider will insert a needle directly into a muscle. Most any muscle can be used. Most often, an arm muscle is used. A buttocks muscle can also be used. Many people say shots in that area are less painful.  A small adhesive bandage may be put over the injection site. It usually can be taken off in an hour or less. Injections can be given by your healthcare provider. In some cases, family members give them. Sometimes, people give them to themselves. Talk with your healthcare provider about what would be best for you. If someone other than your healthcare provider will be giving the shots, the person will need to be trained to give them correctly. HOME CARE INSTRUCTIONS   You can remove the adhesive bandage within an hour of getting a  shot.  You should be able to go about your normal activities right away.  Avoid drinking large amounts of alcohol while taking vitamin B12 shots. Alcohol can interfere with the body's use of the vitamin. SEEK MEDICAL CARE IF:   Pain, redness, swelling or tenderness at the injection site does not get better or gets worse.  Headache, dizziness or weakness does not go away.  You develop a fever of more than 100.5 F (38.1 C). SEEK IMMEDIATE MEDICAL CARE IF:   You have chest pain.  You develop shortness of breath.  You have muscle weakness that gets worse.  You develop numbness, weakness or tingling on one side or one area of the body.  You have symptoms of an allergic reaction, such as:  Hives.  Difficulty breathing.  Swelling of the lips, face, tongue or throat.  You develop a fever of more than 102.0 F (38.9 C). MAKE SURE YOU:   Understand these instructions.  Will watch your condition.  Will get help right away if you are not doing well or get worse. Document   Released: 01/02/2009 Document Revised: 12/29/2011 Document Reviewed: 01/02/2009 Exit[employer] Patient Information 2015 [employer]. This information is not intended to replace advice given to you by your health care provider. Make sure you discuss any questions you have with your health care provider.  

## 2014-07-05 NOTE — Progress Notes (Signed)
Patient ID: Sherry Fox, female   DOB: August 30, 1930, 80 y.o.   MRN: [id number] Vitamin b12 given and tolerated well

## 2014-07-27 ENCOUNTER — Emergency Department (HOSPITAL_COMMUNITY): Payer: Medicare Other

## 2014-07-27 ENCOUNTER — Emergency Department (HOSPITAL_COMMUNITY)
Admission: EM | Admit: 2014-07-27 | Discharge: 2014-07-27 | Disposition: A | Discharge status: Home / Self Care | Payer: Medicare Other | Attending: Emergency Medicine | Admitting: Emergency Medicine

## 2014-07-27 ENCOUNTER — Encounter (HOSPITAL_COMMUNITY): Payer: Self-pay | Admitting: Emergency Medicine

## 2014-07-27 DIAGNOSIS — S42291A Other displaced fracture of upper end of right humerus, initial encounter for closed fracture: Secondary | ICD-10-CM

## 2014-07-27 DIAGNOSIS — W01198A Fall on same level from slipping, tripping and stumbling with subsequent striking against other object, initial encounter: Secondary | ICD-10-CM | POA: Insufficient documentation

## 2014-07-27 DIAGNOSIS — Z23 Encounter for immunization: Secondary | ICD-10-CM | POA: Insufficient documentation

## 2014-07-27 DIAGNOSIS — S0081XA Abrasion of other part of head, initial encounter: Secondary | ICD-10-CM | POA: Diagnosis not present

## 2014-07-27 DIAGNOSIS — Z8739 Personal history of other diseases of the musculoskeletal system and connective tissue: Secondary | ICD-10-CM | POA: Diagnosis not present

## 2014-07-27 DIAGNOSIS — Y929 Unspecified place or not applicable: Secondary | ICD-10-CM | POA: Insufficient documentation

## 2014-07-27 DIAGNOSIS — Z79899 Other long term (current) drug therapy: Secondary | ICD-10-CM | POA: Insufficient documentation

## 2014-07-27 DIAGNOSIS — T07XXXA Unspecified multiple injuries, initial encounter: Secondary | ICD-10-CM

## 2014-07-27 DIAGNOSIS — S0990XA Unspecified injury of head, initial encounter: Secondary | ICD-10-CM | POA: Diagnosis present

## 2014-07-27 DIAGNOSIS — S50311A Abrasion of right elbow, initial encounter: Secondary | ICD-10-CM | POA: Diagnosis not present

## 2014-07-27 DIAGNOSIS — F419 Anxiety disorder, unspecified: Secondary | ICD-10-CM | POA: Diagnosis not present

## 2014-07-27 DIAGNOSIS — Y9389 Activity, other specified: Secondary | ICD-10-CM | POA: Insufficient documentation

## 2014-07-27 DIAGNOSIS — S42211A Unspecified displaced fracture of surgical neck of right humerus, initial encounter for closed fracture: Secondary | ICD-10-CM | POA: Diagnosis not present

## 2014-07-27 DIAGNOSIS — W19XXXA Unspecified fall, initial encounter: Secondary | ICD-10-CM

## 2014-07-27 DIAGNOSIS — Z8639 Personal history of other endocrine, nutritional and metabolic disease: Secondary | ICD-10-CM | POA: Insufficient documentation

## 2014-07-27 DIAGNOSIS — I10 Essential (primary) hypertension: Secondary | ICD-10-CM | POA: Diagnosis not present

## 2014-07-27 LAB — CBC WITH DIFFERENTIAL/PLATELET
BASOS ABS: 0 10*3/uL (ref 0.0–0.1)
Basophils Relative: 0 % (ref 0–1)
Eosinophils Absolute: 0 10*3/uL (ref 0.0–0.7)
Eosinophils Relative: 1 % (ref 0–5)
HCT: 38.3 % (ref 36.0–46.0)
Hemoglobin: 12.6 g/dL (ref 12.0–15.0)
LYMPHS ABS: 1.1 10*3/uL (ref 0.7–4.0)
LYMPHS PCT: 16 % (ref 12–46)
MCH: 29.7 pg (ref 26.0–34.0)
MCHC: 32.9 g/dL (ref 30.0–36.0)
MCV: 90.3 fL (ref 78.0–100.0)
Monocytes Absolute: 0.3 10*3/uL (ref 0.1–1.0)
Monocytes Relative: 5 % (ref 3–12)
Neutro Abs: 5 10*3/uL (ref 1.7–7.7)
Neutrophils Relative %: 78 % — ABNORMAL HIGH (ref 43–77)
Platelets: 140 10*3/uL — ABNORMAL LOW (ref 150–400)
RBC: 4.24 MIL/uL (ref 3.87–5.11)
RDW: 13.5 % (ref 11.5–15.5)
WBC: 6.4 10*3/uL (ref 4.0–10.5)

## 2014-07-27 LAB — BASIC METABOLIC PANEL
ANION GAP: 11 (ref 5–15)
BUN: 38 mg/dL — ABNORMAL HIGH (ref 6–23)
CO2: 27 meq/L (ref 19–32)
Calcium: 9.3 mg/dL (ref 8.4–10.5)
Chloride: 101 mEq/L (ref 96–112)
Creatinine, Ser: 1.19 mg/dL — ABNORMAL HIGH (ref 0.50–1.10)
GFR calc Af Amer: 48 mL/min — ABNORMAL LOW (ref >90)
GFR, EST NON AFRICAN AMERICAN: 41 mL/min — AB (ref >90)
GLUCOSE: 111 mg/dL — AB (ref 70–99)
POTASSIUM: 4.2 meq/L (ref 3.7–5.3)
SODIUM: 139 meq/L (ref 137–147)

## 2014-07-27 MED ORDER — TRAMADOL HCL 50 MG PO TABS: 50.0000 mg | ORAL_TABLET | Freq: Four times a day (QID) | ORAL | Status: DC | PRN

## 2014-07-27 MED ORDER — TRAMADOL HCL 50 MG PO TABS
50.0000 mg | ORAL_TABLET | Freq: Once | ORAL | Status: AC
  Administered 2014-07-27: 50 mg via ORAL
  Filled 2014-07-27: qty 1

## 2014-07-27 MED ORDER — TETANUS-DIPHTH-ACELL PERTUSSIS 5-2.5-18.5 LF-MCG/0.5 IM SUSP
0.5000 mL | Freq: Once | INTRAMUSCULAR | Status: AC
  Administered 2014-07-27: 0.5 mL via INTRAMUSCULAR
  Filled 2014-07-27: qty 0.5

## 2014-07-27 MED ORDER — BACITRACIN ZINC 500 UNIT/GM EX OINT
TOPICAL_OINTMENT | CUTANEOUS | Status: AC
  Administered 2014-07-27: 1
  Filled 2014-07-27: qty 0.9

## 2014-07-27 NOTE — ED Notes (Signed)
Larey Seat this am, abrasion to forehead, No LOC.  Abrasion tor rt knee and rt arm.    Pt is from [facility].

## 2014-07-27 NOTE — ED Notes (Signed)
Pt assisted with dressing, able to move self from stretcher to w/c with one assist. Daughter here and instructed in sling/swath application. Will transport pt home to assisted living.

## 2014-07-27 NOTE — ED Provider Notes (Signed)
CSN: [id number]     Arrival date & time 07/27/14  1142 History   First MD Initiated Contact with Patient 07/27/14 1711     No chief complaint on file.    (Consider location/radiation/quality/duration/timing/severity/associated sxs/prior Treatment) The history is provided by the patient and a relative.   80 year old female status post fall at [facility]. Patient tripped there was no loss of consciousness. The fall occurred this morning. She did hit her for head. There is an abrasion to her for head is an abrasion to her right elbow patient also complaining about an abrasion to right knee. Patient has pain to the upper part of the right arm. Denies neck pain back pain abdominal pain chest pain shortness of breath. No significant knee pain. Patient's tetanus is not up-to-date. Patient states that the right arm pain is an 8/10.  Past Medical History  Diagnosis Date  . Arthritis   . Hypercholesteremia   . Hypertension   . Anxiety    Past Surgical History  Procedure Laterality Date  . Kidney cyst removal     History reviewed. No pertinent family history. History  Substance Use Topics  . Smoking status: Never Smoker   . Smokeless tobacco: Not on file  . Alcohol Use: No   OB History   Grav Para Term Preterm Abortions TAB SAB Ect Mult Living                 Review of Systems  Constitutional: Negative for fever.  HENT: Negative for nosebleeds.   Eyes: Negative for visual disturbance.  Respiratory: Negative for shortness of breath.   Cardiovascular: Negative for chest pain.  Gastrointestinal: Negative for nausea, vomiting and abdominal distention.  Genitourinary: Negative for dysuria.  Musculoskeletal: Negative for back pain and neck pain.  Skin: Positive for wound.  Neurological: Negative for syncope.  Hematological: Does not bruise/bleed easily.  Psychiatric/Behavioral: Negative for confusion.      Allergies  Meloxicam  Home Medications   Prior to  Admission medications   Medication Sig Start Date End Date Taking? Authorizing Provider  cetirizine (ZYRTEC) 10 MG tablet Take 1 tablet (10 mg total) by mouth daily. 02/08/14  Yes Deatra Canter, FNP  citalopram (CELEXA) 20 MG tablet Take 20 mg by mouth daily. anxiety   Yes Historical Provider, MD  cyanocobalamin (,VITAMIN B-12,) 1000 MCG/ML injection Inject one ml IM monthly 05/31/14  Yes Deatra Canter, FNP  lisinopril-hydrochlorothiazide (PRINZIDE,ZESTORETIC) 20-25 MG per tablet Take 1 tablet by mouth daily.   Yes Historical Provider, MD  mirabegron ER (MYRBETRIQ) 25 MG TB24 tablet Take 1 tablet (25 mg total) by mouth daily. 05/31/14  Yes Deatra Canter, FNP  risperiDONE (RISPERDAL) 0.25 MG tablet Take 1 tablet (0.25 mg total) by mouth at bedtime. 03/06/14  Yes Deatra Canter, FNP  traMADol (ULTRAM) 50 MG tablet Take 1 tablet (50 mg total) by mouth every 6 (six) hours as needed. 07/27/14   Vanetta Mulders, MD   BP 112/55  Pulse 77  Temp(Src) 97.6 F (36.4 C) (Oral)  Resp 18  Ht 5\' 2"  (1.575 m)  Wt 192 lb (87.091 kg)  BMI 35.11 kg/m2  SpO2 95% Physical Exam  Nursing note and vitals reviewed. Constitutional: She is oriented to person, place, and time. She appears well-developed and well-nourished.  HENT:  Head: Normocephalic.  Mouth/Throat: Oropharynx is clear and moist.  Abrasion to the 4 head measuring about 1.5 cm oval shaped no active bleeding. No step off.  Eyes: Conjunctivae  and EOM are normal. Pupils are equal, round, and reactive to light.  Neck: Normal range of motion. Neck supple.  Cardiovascular: Normal rate, regular rhythm and normal heart sounds.   Pulmonary/Chest: Effort normal and breath sounds normal. No respiratory distress.  Abdominal: Soft. Bowel sounds are normal. There is no tenderness.  Musculoskeletal: She exhibits tenderness.  Tenderness to palpation of the humerus. No obvious shoulder deformity or dislocation. Good range of motion at the elbow and wrist  and hand radial pulses 1-2+. Sensation is intact distally on the right arm. Abrasion to the right elbow measuring about 5 mm. No active bleeding. No significant swelling. Very superficial duration to the right knee no significant swelling no patellar dislocation good range of motion the knee.  Neurological: She is alert and oriented to person, place, and time. No cranial nerve deficit. She exhibits normal muscle tone. Coordination normal.  Skin: Skin is warm.    ED Course  Procedures (including critical care time) Labs Review Labs Reviewed  BASIC METABOLIC PANEL - Abnormal; Notable for the following:    Glucose, Bld 111 (*)    BUN 38 (*)    Creatinine, Ser 1.19 (*)    GFR calc non Af Amer 41 (*)    GFR calc Af Amer 48 (*)    All other components within normal limits  CBC WITH DIFFERENTIAL - Abnormal; Notable for the following:    Platelets 140 (*)    Neutrophils Relative % 78 (*)    All other components within normal limits   Results for orders placed during the hospital encounter of 07/27/14  BASIC METABOLIC PANEL      Result Value Ref Range   Sodium 139  137 - 147 mEq/L   Potassium 4.2  3.7 - 5.3 mEq/L   Chloride 101  96 - 112 mEq/L   CO2 27  19 - 32 mEq/L   Glucose, Bld 111 (*) 70 - 99 mg/dL   BUN 38 (*) 6 - 23 mg/dL   Creatinine, Ser 4.401.19 (*) 0.50 - 1.10 mg/dL   Calcium 9.3  8.4 - 10.210.5 mg/dL   GFR calc non Af Amer 41 (*) >90 mL/min   GFR calc Af Amer 48 (*) >90 mL/min   Anion gap 11  5 - 15  CBC WITH DIFFERENTIAL      Result Value Ref Range   WBC 6.4  4.0 - 10.5 K/uL   RBC 4.24  3.87 - 5.11 MIL/uL   Hemoglobin 12.6  12.0 - 15.0 g/dL   HCT 72.538.3  36.636.0 - 44.046.0 %   MCV 90.3  78.0 - 100.0 fL   MCH 29.7  26.0 - 34.0 pg   MCHC 32.9  30.0 - 36.0 g/dL   RDW 34.713.5  42.511.5 - 95.615.5 %   Platelets 140 (*) 150 - 400 K/uL   Neutrophils Relative % 78 (*) 43 - 77 %   Neutro Abs 5.0  1.7 - 7.7 K/uL   Lymphocytes Relative 16  12 - 46 %   Lymphs Abs 1.1  0.7 - 4.0 K/uL   Monocytes  Relative 5  3 - 12 %   Monocytes Absolute 0.3  0.1 - 1.0 K/uL   Eosinophils Relative 1  0 - 5 %   Eosinophils Absolute 0.0  0.0 - 0.7 K/uL   Basophils Relative 0  0 - 1 %   Basophils Absolute 0.0  0.0 - 0.1 K/uL     Imaging Review Dg Shoulder Right  07/27/2014  CLINICAL DATA:  Acute right shoulder pain after fall at nursing facility.  EXAM: RIGHT SHOULDER - 2+ VIEW  COMPARISON:  None.  FINDINGS: There is no evidence of fracture or dislocation. There is no evidence of arthropathy or other focal bone abnormality. Soft tissues are unremarkable.  IMPRESSION: Normal right shoulder.   Electronically Signed   By: Roque Lias M.D.   On: 07/27/2014 13:35   Dg Elbow Complete Right  07/27/2014   CLINICAL DATA:  Patient fell at home earlier today and landed on right arm  EXAM: RIGHT ELBOW - COMPLETE 3+ VIEW  COMPARISON:  None.  FINDINGS: Frontal, lateral, and bilateral oblique views were obtained. There is no fracture, dislocation, or effusion. Joint spaces appear intact. No erosive change.  IMPRESSION: No fracture or dislocation.  No appreciable arthropathy.   Electronically Signed   By: Bretta Bang M.D.   On: 07/27/2014 13:35   Ct Head Wo Contrast  07/27/2014   CLINICAL DATA:  Fall today and hit head on carpet on floor; has abrasion to RT forehead about 2 cm above RT eyebrow/ no headache at this time  EXAM: CT HEAD WITHOUT CONTRAST  TECHNIQUE: Contiguous axial images were obtained from the base of the skull through the vertex without intravenous contrast.  COMPARISON:  03/02/2014  FINDINGS: There is no evidence of mass effect, midline shift, or extra-axial fluid collections. There is no evidence of a space-occupying lesion or intracranial hemorrhage. There is no evidence of a cortical-based area of acute infarction. There is generalized cerebral atrophy. There is periventricular white matter low attenuation likely secondary to microangiopathy.  The ventricles and sulci are appropriate for the  patient's age. The basal cisterns are patent.  Visualized portions of the orbits are unremarkable. The visualized portions of the paranasal sinuses and mastoid air cells are unremarkable. Cerebrovascular atherosclerotic calcifications are noted.  The osseous structures are unremarkable.  IMPRESSION: No acute intracranial pathology.   Electronically Signed   By: Elige Ko   On: 07/27/2014 18:12   Dg Humerus Right  07/27/2014   CLINICAL DATA:  Pain shoulder and elbow. Fall while shopping this a.m. Initial evaluation.  EXAM: RIGHT HUMERUS - 2+ VIEW  COMPARISON:  None.  FINDINGS: Nondisplaced fracture of the right humeral head is noted. Humeral shaft and distal humerus intact. The humeral head fracture may extend into the glenohumeral joint space.  IMPRESSION: Nondisplaced right humeral head fracture.   Electronically Signed   By: Maisie Fus  Register   On: 07/27/2014 13:36     EKG Interpretation None      MDM   Final diagnoses:  Fall, initial encounter  Abrasions of multiple sites  Head injury, initial encounter  Humerus head fracture, right, closed, initial encounter    Patient status post fall abrasion to fore head and abrasion to right elbow. Workup to include CT head and x-rays of the right arm. Head CT negative for skull fracture or any intracranial injury. Patient has no neck pain. Patient's tetanus was updated. Patient's x-rays of the arm show a humeral head fracture of the right humerus. Treated with a sling shoulder immobilizer. Will require followup with orthopedics referral information provided. Patient's pain treated with tramadol here and prescription provided for home. Patient will have difficulty using walker patient's daughter aware. Wounds to be dressed with Neosporin or bacitracin ointment daily. Call orthopedics for followup in the morning. Patient's basic labs without significant abnormalities other than some renal insufficiency but no significant change from previous  labs.  Vanetta Mulders, MD 07/27/14 2022

## 2014-07-27 NOTE — Discharge Instructions (Signed)
The patient workup for the full. Head CT negative for skull fracture or brain injury. X-ray of the right arm shows a humeral head fracture. This will require orthopedic followup referral information provided. Abrasion to the right elbow and right for head dressed with antibiotic ointment. Would apply fresh antibiotic ointment each day. Patient has tramadol to take for the arm pain. He arm needs to be in the sling. Patient will not be able to use her walker. Patient's tetanus was updated.

## 2014-08-02 ENCOUNTER — Ambulatory Visit (INDEPENDENT_AMBULATORY_CARE_PROVIDER_SITE_OTHER): Payer: Medicare Other

## 2014-08-02 DIAGNOSIS — Z23 Encounter for immunization: Secondary | ICD-10-CM

## 2014-09-26 ENCOUNTER — Telehealth: Payer: Self-pay | Admitting: Family Medicine

## 2014-09-26 NOTE — Telephone Encounter (Signed)
Appointment given for Friday with [facility] at 10:30.

## 2014-09-29 ENCOUNTER — Ambulatory Visit: Payer: Medicare Other | Admitting: Family Medicine

## 2014-12-21 ENCOUNTER — Telehealth: Payer: Self-pay | Admitting: Family Medicine

## 2014-12-21 NOTE — Telephone Encounter (Signed)
Patient daughter was offered an appointment for tonight at 6:45 but daughter wanted earlier appointment and i advised them that all we had was 6:45 tonight. Daughter did not want appointment.

## 2014-12-28 ENCOUNTER — Encounter (INDEPENDENT_AMBULATORY_CARE_PROVIDER_SITE_OTHER): Payer: Self-pay

## 2014-12-28 ENCOUNTER — Encounter: Payer: Self-pay | Admitting: Family Medicine

## 2014-12-28 ENCOUNTER — Ambulatory Visit (INDEPENDENT_AMBULATORY_CARE_PROVIDER_SITE_OTHER): Payer: Medicare Other | Admitting: Family Medicine

## 2014-12-28 VITALS — BP 96/65 | HR 89 | Temp 98.6°F | Ht 62.0 in | Wt 191.0 lb

## 2014-12-28 DIAGNOSIS — J00 Acute nasopharyngitis [common cold]: Secondary | ICD-10-CM | POA: Diagnosis not present

## 2014-12-28 DIAGNOSIS — R300 Dysuria: Secondary | ICD-10-CM | POA: Diagnosis not present

## 2014-12-28 DIAGNOSIS — B9689 Other specified bacterial agents as the cause of diseases classified elsewhere: Secondary | ICD-10-CM

## 2014-12-28 DIAGNOSIS — E538 Deficiency of other specified B group vitamins: Secondary | ICD-10-CM

## 2014-12-28 DIAGNOSIS — R443 Hallucinations, unspecified: Secondary | ICD-10-CM | POA: Diagnosis not present

## 2014-12-28 DIAGNOSIS — J208 Acute bronchitis due to other specified organisms: Secondary | ICD-10-CM

## 2014-12-28 LAB — POCT URINALYSIS DIPSTICK
BILIRUBIN UA: NEGATIVE
Glucose, UA: NEGATIVE
KETONES UA: NEGATIVE
Nitrite, UA: NEGATIVE
PH UA: 7.5
Protein, UA: NEGATIVE
Urobilinogen, UA: NEGATIVE

## 2014-12-28 LAB — POCT CBC
Granulocyte percent: 73 %G (ref 37–80)
HEMATOCRIT: 43.3 % (ref 37.7–47.9)
Hemoglobin: 13.4 g/dL (ref 12.2–16.2)
Lymph, poc: 1.4 (ref 0.6–3.4)
MCH, POC: 27.6 pg (ref 27–31.2)
MCHC: 30.9 g/dL — AB (ref 31.8–35.4)
MCV: 89.3 fL (ref 80–97)
MPV: 8.7 fL (ref 0–99.8)
PLATELET COUNT, POC: 154 10*3/uL (ref 142–424)
POC GRANULOCYTE: 4.4 (ref 2–6.9)
POC LYMPH PERCENT: 23.8 %L (ref 10–50)
RBC: 4.85 M/uL (ref 4.04–5.48)
RDW, POC: 12.9 %
WBC: 6 10*3/uL (ref 4.6–10.2)

## 2014-12-28 LAB — POCT UA - MICROSCOPIC ONLY
BACTERIA, U MICROSCOPIC: NEGATIVE
CASTS, UR, LPF, POC: NEGATIVE
CRYSTALS, UR, HPF, POC: NEGATIVE
Mucus, UA: NEGATIVE
Yeast, UA: NEGATIVE

## 2014-12-28 MED ORDER — LEVOFLOXACIN 500 MG PO TABS: 500.0000 mg | ORAL_TABLET | Freq: Every day | ORAL | Status: DC

## 2014-12-28 MED ORDER — RISPERIDONE 0.5 MG PO TABS
0.5000 mg | ORAL_TABLET | Freq: Every day | ORAL | Status: DC
Start: 2014-12-28 — End: 2015-01-17

## 2014-12-28 MED ORDER — CYANOCOBALAMIN 1000 MCG/ML IJ SOLN: 1000.0000 ug | INTRAMUSCULAR | Status: AC

## 2014-12-28 NOTE — Progress Notes (Signed)
Subjective:    Patient ID: Sherry Fox, female    DOB: September 25, 1930, 81 y.o.   MRN: [id number]  HPI Patient here today for several complaints. She is a [employer] Patient and she was brought in today by her daughter. She complains of cough, dysuria, nervousness, and not sleeping. The patient has productivity from the cough. She also has mild dyspnea. She's had some high lateral edema of the ankles as well. She has some history of dementia but is able to converse briefly today. She has hard of hearing. She has some upper respiratory congestion that is mild. She denies earache denies sore throat denies headache and sinus pain. Onset of these symptoms was 5 days ago. It is worsening. Daughter states that she continues to have some agitation and possibly even hallucinations. She is not sleeping well at night and needs some help with that.       Patient Active Problem List   Diagnosis Date Noted  . Hallucination 03/03/2014  . Altered mental status 03/02/2014  . Hypnagogic hallucinations 03/02/2014  . Depression 03/02/2014  . Essential hypertension, benign 04/11/2013  . Morbid obesity 04/11/2013  . Other and unspecified hyperlipidemia 04/11/2013  . Diverticulosis of colon without hemorrhage 04/11/2013  . DJD (degenerative joint disease) 04/11/2013   Outpatient Encounter Prescriptions as of 12/28/2014  Medication Sig  . cetirizine (ZYRTEC) 10 MG tablet Take 1 tablet (10 mg total) by mouth daily.  . citalopram (CELEXA) 20 MG tablet Take 20 mg by mouth daily. anxiety  . cyanocobalamin (,VITAMIN B-12,) 1000 MCG/ML injection Inject one ml IM monthly  . lisinopril-hydrochlorothiazide (PRINZIDE,ZESTORETIC) 20-25 MG per tablet Take 1 tablet by mouth daily.  . mirabegron ER (MYRBETRIQ) 25 MG TB24 tablet Take 1 tablet (25 mg total) by mouth daily.  . risperiDONE (RISPERDAL) 0.5 MG tablet Take 1 tablet (0.5 mg total) by mouth at bedtime.  . traMADol (ULTRAM) 50 MG tablet Take 1 tablet (50 mg total) by  mouth every 6 (six) hours as needed.  . [DISCONTINUED] risperiDONE (RISPERDAL) 0.25 MG tablet Take 1 tablet (0.25 mg total) by mouth at bedtime.  Marland Kitchen levofloxacin (LEVAQUIN) 500 MG tablet Take 1 tablet (500 mg total) by mouth daily.    Review of Systems  Constitutional: Negative.        Not sleeping well  HENT: Negative.   Eyes: Negative.   Respiratory: Positive for cough.   Cardiovascular: Negative.   Gastrointestinal: Positive for constipation.  Endocrine: Negative.   Genitourinary: Positive for dysuria.  Musculoskeletal: Negative.   Skin: Negative.   Allergic/Immunologic: Negative.   Neurological: Negative.   Hematological: Negative.   Psychiatric/Behavioral: The patient is nervous/anxious.        Objective:   Physical Exam  Constitutional: She is oriented to person, place, and time. She appears well-developed and well-nourished. No distress.  HENT:  Head: Normocephalic and atraumatic.  Right Ear: External ear normal.  Left Ear: External ear normal.  Nose: Nose normal.  Mouth/Throat: Oropharynx is clear and moist.  Eyes: Conjunctivae and EOM are normal. Pupils are equal, round, and reactive to light.  Neck: Normal range of motion. Neck supple. No thyromegaly present.  Cardiovascular: Normal rate, regular rhythm and normal heart sounds.   No murmur heard. Pulmonary/Chest: Effort normal. No respiratory distress. She has wheezes (rhonchi throughout). She has no rales.  Abdominal: Soft. Bowel sounds are normal. She exhibits no distension. There is no tenderness.  Lymphadenopathy:    She has no cervical adenopathy.  Neurological: She is alert  and oriented to person, place, and time. She has normal reflexes.  Skin: Skin is warm and dry.  Psychiatric: She has a normal mood and affect. Her behavior is normal. Judgment and thought content normal.   BP 96/65 mmHg  Pulse 89  Temp(Src) 98.6 F (37 C) (Oral)  Ht _0  (1.575 m)  Wt 191 lb (86.637 kg)  BMI 34.93 kg/m2         Assessment & Plan:   1. Dysuria   2. Hallucinations   3. Acute bacterial bronchitis     Meds ordered this encounter  Medications  . levofloxacin (LEVAQUIN) 500 MG tablet    Sig: Take 1 tablet (500 mg total) by mouth daily.    Dispense:  10 tablet    Refill:  0  . risperiDONE (RISPERDAL) 0.5 MG tablet    Sig: Take 1 tablet (0.5 mg total) by mouth at bedtime.    Dispense:  90 tablet    Refill:  3  . cyanocobalamin ((VITAMIN B-12)) injection 1,000 mcg    Sig:     Orders Placed This Encounter  Procedures  . Urine culture  . TSH  . CMP14+EGFR  . POCT UA - Microscopic Only  . POCT urinalysis dipstick  . POCT CBC    Labs pending Health Maintenance reviewed Diet and exercise encouraged Continue all meds as discussed Follow up in 28month WClaretta Fraise MD

## 2014-12-29 ENCOUNTER — Encounter: Payer: Self-pay | Admitting: Family Medicine

## 2014-12-29 LAB — CMP14+EGFR
ALBUMIN: 4.2 g/dL (ref 3.5–4.7)
ALT: 12 IU/L (ref 0–32)
AST: 15 IU/L (ref 0–40)
Albumin/Globulin Ratio: 1.4 (ref 1.1–2.5)
Alkaline Phosphatase: 80 IU/L (ref 39–117)
BILIRUBIN TOTAL: 0.5 mg/dL (ref 0.0–1.2)
BUN/Creatinine Ratio: 22 (ref 11–26)
BUN: 30 mg/dL — ABNORMAL HIGH (ref 8–27)
CALCIUM: 9.5 mg/dL (ref 8.7–10.3)
CHLORIDE: 99 mmol/L (ref 97–108)
CO2: 26 mmol/L (ref 18–29)
Creatinine, Ser: 1.36 mg/dL — ABNORMAL HIGH (ref 0.57–1.00)
GFR calc Af Amer: 42 mL/min/{1.73_m2} — ABNORMAL LOW (ref >59)
GFR, EST NON AFRICAN AMERICAN: 36 mL/min/{1.73_m2} — AB (ref >59)
GLUCOSE: 105 mg/dL — AB (ref 65–99)
Globulin, Total: 2.9 g/dL (ref 1.5–4.5)
POTASSIUM: 4.1 mmol/L (ref 3.5–5.2)
Sodium: 140 mmol/L (ref 134–144)
TOTAL PROTEIN: 7.1 g/dL (ref 6.0–8.5)

## 2014-12-29 LAB — TSH: TSH: 1.3 u[IU]/mL (ref 0.450–4.500)

## 2014-12-30 LAB — URINE CULTURE

## 2015-01-01 ENCOUNTER — Encounter: Payer: Self-pay | Admitting: Family Medicine

## 2015-01-01 ENCOUNTER — Telehealth: Payer: Self-pay | Admitting: Family Medicine

## 2015-01-01 NOTE — Telephone Encounter (Signed)
Spoke with caregiver at [facility] Informed of Dr Darlyn Read acknowledgment that these 2 meds do not typically cause back pain adn will need to be seen to evaluate Verbalizes understanding

## 2015-01-01 NOTE — Telephone Encounter (Signed)
Those meds do not typically cause back pain

## 2015-01-05 ENCOUNTER — Telehealth: Payer: Self-pay | Admitting: Family Medicine

## 2015-01-05 NOTE — Telephone Encounter (Signed)
Pt given appt with Dr.Stacks 3/23 at 11:25.

## 2015-01-10 ENCOUNTER — Ambulatory Visit (INDEPENDENT_AMBULATORY_CARE_PROVIDER_SITE_OTHER): Payer: Medicare Other

## 2015-01-10 ENCOUNTER — Ambulatory Visit (INDEPENDENT_AMBULATORY_CARE_PROVIDER_SITE_OTHER): Payer: Medicare Other | Admitting: Family Medicine

## 2015-01-10 ENCOUNTER — Encounter: Payer: Self-pay | Admitting: Family Medicine

## 2015-01-10 VITALS — Temp 97.1°F | Ht 62.0 in | Wt 193.4 lb

## 2015-01-10 DIAGNOSIS — M545 Low back pain, unspecified: Secondary | ICD-10-CM

## 2015-01-10 DIAGNOSIS — R300 Dysuria: Secondary | ICD-10-CM | POA: Diagnosis not present

## 2015-01-10 LAB — POCT URINALYSIS DIPSTICK
Bilirubin, UA: NEGATIVE
Glucose, UA: NEGATIVE
Ketones, UA: NEGATIVE
Nitrite, UA: NEGATIVE
PH UA: 5
Spec Grav, UA: >=1.03
Urobilinogen, UA: NEGATIVE

## 2015-01-10 NOTE — Progress Notes (Signed)
Subjective:  Patient ID: Sherry Fox, female    DOB: 07-27-1930  Age: 81 y.o. MRN: [id number]  CC: Fatigue; Dizziness; and Back Pain   HPI Sherry Fox presents for memory worsening. Can't adjust thermostat. Can't use TV remote. Says she needs help.  History Sherry Fox has a past medical history of Arthritis; Hypercholesteremia; Hypertension; and Anxiety.   She has past surgical history that includes Kidney cyst removal.   Her family history is not on file.She reports that she has never smoked. She does not have any smokeless tobacco history on file. She reports that she does not drink alcohol or use illicit drugs.  Current Outpatient Prescriptions on File Prior to Visit  Medication Sig Dispense Refill  . cetirizine (ZYRTEC) 10 MG tablet Take 1 tablet (10 mg total) by mouth daily. 90 tablet 0  . citalopram (CELEXA) 20 MG tablet Take 20 mg by mouth daily. anxiety    . lisinopril-hydrochlorothiazide (PRINZIDE,ZESTORETIC) 20-25 MG per tablet Take 1 tablet by mouth daily.    . mirabegron ER (MYRBETRIQ) 25 MG TB24 tablet Take 1 tablet (25 mg total) by mouth daily. 30 tablet 5  . risperiDONE (RISPERDAL) 0.5 MG tablet Take 1 tablet (0.5 mg total) by mouth at bedtime. 90 tablet 3  . cyanocobalamin (,VITAMIN B-12,) 1000 MCG/ML injection Inject one ml IM monthly 10 mL 3   Current Facility-Administered Medications on File Prior to Visit  Medication Dose Route Frequency Provider Last Rate Last Dose  . cyanocobalamin ((VITAMIN B-12)) injection 1,000 mcg  1,000 mcg Intramuscular Q30 days Deatra Canter, FNP   1,000 mcg at 12/28/14 1200  . cyanocobalamin ((VITAMIN B-12)) injection 1,000 mcg  1,000 mcg Intramuscular Q30 days Mechele Claude, MD        ROS Review of Systems  Constitutional: Positive for fatigue. Negative for fever, chills, diaphoresis, appetite change and unexpected weight change.  HENT: Negative for congestion, ear pain, hearing loss, postnasal drip, rhinorrhea, sneezing, sore throat  and trouble swallowing.   Eyes: Negative for pain.  Respiratory: Negative for cough, chest tightness and shortness of breath.   Cardiovascular: Negative for chest pain and palpitations.  Gastrointestinal: Negative for nausea, vomiting, abdominal pain, diarrhea and constipation.  Genitourinary: Negative.        See HPI  Musculoskeletal: Positive for back pain. Negative for joint swelling and arthralgias.  Skin: Negative for rash.  Neurological: Positive for dizziness. Negative for weakness, numbness and headaches.  Psychiatric/Behavioral: Negative for dysphoric mood and agitation.    Objective:  Temp(Src) 97.1 F (36.2 C) (Oral)  Ht  (1.575 m)  Wt 193 lb 6.4 oz (87.726 kg)  BMI 35.36 kg/m2  BP Readings from Last 3 Encounters:  12/28/14 96/65  07/27/14 112/55  05/31/14 125/72    Wt Readings from Last 3 Encounters:  01/10/15 193 lb 6.4 oz (87.726 kg)  12/28/14 191 lb (86.637 kg)  07/27/14 192 lb (87.091 kg)     Physical Exam  Constitutional: She is oriented to person, place, and time. She appears well-developed and well-nourished. No distress.  HENT:  Head: Normocephalic and atraumatic.  Right Ear: External ear normal.  Left Ear: External ear normal.  Nose: Nose normal.  Mouth/Throat: Oropharynx is clear and moist.  Eyes: Conjunctivae and EOM are normal. Pupils are equal, round, and reactive to light.  Neck: Normal range of motion. Neck supple. No thyromegaly present.  Cardiovascular: Normal rate, regular rhythm and normal heart sounds.   No murmur heard. Pulmonary/Chest: Effort normal and breath sounds  normal. No respiratory distress. She has no wheezes. She has no rales.  Abdominal: Soft. Bowel sounds are normal. She exhibits no distension. There is no tenderness.  Lymphadenopathy:    She has no cervical adenopathy.  Neurological: She is alert and oriented to person, place, and time. She has normal reflexes.  Skin: Skin is warm and dry.  Psychiatric: She has a  normal mood and affect. Her behavior is normal. Judgment and thought content normal.    No results found for: HGBA1C  Lab Results  Component Value Date   WBC 6.0 12/28/2014   HGB 13.4 12/28/2014   HCT 43.3 12/28/2014   PLT 140* 07/27/2014   GLUCOSE 105* 12/28/2014   CHOL 255* 11/30/2013   TRIG 85 11/30/2013   HDL 104 11/30/2013   LDLCALC 134* 11/30/2013   ALT 12 12/28/2014   AST 15 12/28/2014   NA 140 12/28/2014   K 4.1 12/28/2014   CL 99 12/28/2014   CREATININE 1.36* 12/28/2014   BUN 30* 12/28/2014   CO2 26 12/28/2014   TSH 1.300 12/28/2014    Dg Shoulder Right  07/27/2014   CLINICAL DATA:  Acute right shoulder pain after fall at nursing facility.  EXAM: RIGHT SHOULDER - 2+ VIEW  COMPARISON:  None.  FINDINGS: There is no evidence of fracture or dislocation. There is no evidence of arthropathy or other focal bone abnormality. Soft tissues are unremarkable.  IMPRESSION: Normal right shoulder.   Electronically Signed   By: Roque Lias M.D.   On: 07/27/2014 13:35   Dg Elbow Complete Right  07/27/2014   CLINICAL DATA:  Patient fell at home earlier today and landed on right arm  EXAM: RIGHT ELBOW - COMPLETE 3+ VIEW  COMPARISON:  None.  FINDINGS: Frontal, lateral, and bilateral oblique views were obtained. There is no fracture, dislocation, or effusion. Joint spaces appear intact. No erosive change.  IMPRESSION: No fracture or dislocation.  No appreciable arthropathy.   Electronically Signed   By: Bretta Bang M.D.   On: 07/27/2014 13:35   Ct Head Wo Contrast  07/27/2014   CLINICAL DATA:  Fall today and hit head on carpet on floor; has abrasion to RT forehead about 2 cm above RT eyebrow/ no headache at this time  EXAM: CT HEAD WITHOUT CONTRAST  TECHNIQUE: Contiguous axial images were obtained from the base of the skull through the vertex without intravenous contrast.  COMPARISON:  03/02/2014  FINDINGS: There is no evidence of mass effect, midline shift, or extra-axial fluid  collections. There is no evidence of a space-occupying lesion or intracranial hemorrhage. There is no evidence of a cortical-based area of acute infarction. There is generalized cerebral atrophy. There is periventricular white matter low attenuation likely secondary to microangiopathy.  The ventricles and sulci are appropriate for the patient's age. The basal cisterns are patent.  Visualized portions of the orbits are unremarkable. The visualized portions of the paranasal sinuses and mastoid air cells are unremarkable. Cerebrovascular atherosclerotic calcifications are noted.  The osseous structures are unremarkable.  IMPRESSION: No acute intracranial pathology.   Electronically Signed   By: Elige Ko   On: 07/27/2014 18:12   Dg Humerus Right  07/27/2014   CLINICAL DATA:  Pain shoulder and elbow. Fall while shopping this a.m. Initial evaluation.  EXAM: RIGHT HUMERUS - 2+ VIEW  COMPARISON:  None.  FINDINGS: Nondisplaced fracture of the right humeral head is noted. Humeral shaft and distal humerus intact. The humeral head fracture may extend into the glenohumeral joint  space.  IMPRESSION: Nondisplaced right humeral head fracture.   Electronically Signed   By: Maisie Fus  Register   On: 07/27/2014 13:36    Assessment & Plan:   Sherry Fox was seen today for fatigue, dizziness and back pain.  Diagnoses and all orders for this visit:  Low back pain, non-specific Orders: -     POCT urinalysis dipstick -     Cancel: POCT UA - Microscopic Only -     DG Lumbar Spine 2-3 Views; Future   I have discontinued Ms. Batch's traMADol and levofloxacin. I am also having her maintain her cetirizine, lisinopril-hydrochlorothiazide, citalopram, mirabegron ER, cyanocobalamin, and risperiDONE. We will continue to administer cyanocobalamin and cyanocobalamin.  No orders of the defined types were placed in this encounter.   .  Follow-up: Return in about 2 weeks (around 01/24/2015).  Mechele Claude, M.D.

## 2015-01-12 LAB — URINE CULTURE

## 2015-01-14 ENCOUNTER — Encounter (HOSPITAL_COMMUNITY): Payer: Self-pay | Admitting: Emergency Medicine

## 2015-01-14 ENCOUNTER — Inpatient Hospital Stay (HOSPITAL_COMMUNITY)
Admission: EM | Admit: 2015-01-14 | Discharge: 2015-01-17 | DRG: 871 | Disposition: A | Discharge status: Skilled Nursing Facility | Payer: Medicare Other | Attending: Internal Medicine | Admitting: Internal Medicine

## 2015-01-14 ENCOUNTER — Emergency Department (HOSPITAL_COMMUNITY): Payer: Medicare Other

## 2015-01-14 DIAGNOSIS — F0281 Dementia in other diseases classified elsewhere with behavioral disturbance: Secondary | ICD-10-CM | POA: Diagnosis present

## 2015-01-14 DIAGNOSIS — D649 Anemia, unspecified: Secondary | ICD-10-CM | POA: Diagnosis present

## 2015-01-14 DIAGNOSIS — N183 Chronic kidney disease, stage 3 unspecified: Secondary | ICD-10-CM | POA: Diagnosis present

## 2015-01-14 DIAGNOSIS — E861 Hypovolemia: Secondary | ICD-10-CM | POA: Diagnosis not present

## 2015-01-14 DIAGNOSIS — A419 Sepsis, unspecified organism: Principal | ICD-10-CM | POA: Diagnosis present

## 2015-01-14 DIAGNOSIS — S0990XA Unspecified injury of head, initial encounter: Secondary | ICD-10-CM | POA: Diagnosis not present

## 2015-01-14 DIAGNOSIS — E876 Hypokalemia: Secondary | ICD-10-CM | POA: Diagnosis present

## 2015-01-14 DIAGNOSIS — E860 Dehydration: Secondary | ICD-10-CM | POA: Diagnosis present

## 2015-01-14 DIAGNOSIS — G20 Parkinson's disease: Secondary | ICD-10-CM | POA: Diagnosis not present

## 2015-01-14 DIAGNOSIS — R1310 Dysphagia, unspecified: Secondary | ICD-10-CM | POA: Diagnosis not present

## 2015-01-14 DIAGNOSIS — Z66 Do not resuscitate: Secondary | ICD-10-CM | POA: Diagnosis not present

## 2015-01-14 DIAGNOSIS — E785 Hyperlipidemia, unspecified: Secondary | ICD-10-CM | POA: Diagnosis present

## 2015-01-14 DIAGNOSIS — G252 Other specified forms of tremor: Secondary | ICD-10-CM | POA: Diagnosis present

## 2015-01-14 DIAGNOSIS — R4182 Altered mental status, unspecified: Secondary | ICD-10-CM | POA: Diagnosis not present

## 2015-01-14 DIAGNOSIS — J811 Chronic pulmonary edema: Secondary | ICD-10-CM | POA: Diagnosis not present

## 2015-01-14 DIAGNOSIS — D72829 Elevated white blood cell count, unspecified: Secondary | ICD-10-CM

## 2015-01-14 DIAGNOSIS — N179 Acute kidney failure, unspecified: Secondary | ICD-10-CM

## 2015-01-14 DIAGNOSIS — G9340 Encephalopathy, unspecified: Secondary | ICD-10-CM | POA: Diagnosis not present

## 2015-01-14 DIAGNOSIS — J810 Acute pulmonary edema: Secondary | ICD-10-CM | POA: Diagnosis not present

## 2015-01-14 DIAGNOSIS — R443 Hallucinations, unspecified: Secondary | ICD-10-CM

## 2015-01-14 DIAGNOSIS — R21 Rash and other nonspecific skin eruption: Secondary | ICD-10-CM | POA: Diagnosis present

## 2015-01-14 DIAGNOSIS — R410 Disorientation, unspecified: Secondary | ICD-10-CM | POA: Diagnosis present

## 2015-01-14 DIAGNOSIS — S199XXA Unspecified injury of neck, initial encounter: Secondary | ICD-10-CM | POA: Diagnosis not present

## 2015-01-14 DIAGNOSIS — D6959 Other secondary thrombocytopenia: Secondary | ICD-10-CM | POA: Diagnosis present

## 2015-01-14 DIAGNOSIS — R41841 Cognitive communication deficit: Secondary | ICD-10-CM | POA: Diagnosis not present

## 2015-01-14 DIAGNOSIS — I5030 Unspecified diastolic (congestive) heart failure: Secondary | ICD-10-CM | POA: Diagnosis not present

## 2015-01-14 DIAGNOSIS — I10 Essential (primary) hypertension: Secondary | ICD-10-CM | POA: Diagnosis not present

## 2015-01-14 DIAGNOSIS — F0390 Unspecified dementia without behavioral disturbance: Secondary | ICD-10-CM | POA: Diagnosis not present

## 2015-01-14 DIAGNOSIS — F329 Major depressive disorder, single episode, unspecified: Secondary | ICD-10-CM | POA: Diagnosis not present

## 2015-01-14 DIAGNOSIS — G9349 Other encephalopathy: Secondary | ICD-10-CM | POA: Diagnosis present

## 2015-01-14 DIAGNOSIS — N3281 Overactive bladder: Secondary | ICD-10-CM

## 2015-01-14 DIAGNOSIS — I959 Hypotension, unspecified: Secondary | ICD-10-CM

## 2015-01-14 DIAGNOSIS — R5381 Other malaise: Secondary | ICD-10-CM | POA: Diagnosis not present

## 2015-01-14 DIAGNOSIS — F419 Anxiety disorder, unspecified: Secondary | ICD-10-CM | POA: Diagnosis present

## 2015-01-14 DIAGNOSIS — Z888 Allergy status to other drugs, medicaments and biological substances status: Secondary | ICD-10-CM

## 2015-01-14 DIAGNOSIS — Z6835 Body mass index (BMI) 35.0-35.9, adult: Secondary | ICD-10-CM | POA: Diagnosis not present

## 2015-01-14 DIAGNOSIS — I501 Left ventricular failure: Secondary | ICD-10-CM | POA: Diagnosis not present

## 2015-01-14 DIAGNOSIS — M6281 Muscle weakness (generalized): Secondary | ICD-10-CM | POA: Diagnosis not present

## 2015-01-14 DIAGNOSIS — R609 Edema, unspecified: Secondary | ICD-10-CM

## 2015-01-14 DIAGNOSIS — I129 Hypertensive chronic kidney disease with stage 1 through stage 4 chronic kidney disease, or unspecified chronic kidney disease: Secondary | ICD-10-CM | POA: Diagnosis present

## 2015-01-14 DIAGNOSIS — R350 Frequency of micturition: Secondary | ICD-10-CM

## 2015-01-14 DIAGNOSIS — L03116 Cellulitis of left lower limb: Secondary | ICD-10-CM | POA: Diagnosis not present

## 2015-01-14 DIAGNOSIS — I5189 Other ill-defined heart diseases: Secondary | ICD-10-CM | POA: Diagnosis present

## 2015-01-14 DIAGNOSIS — R269 Unspecified abnormalities of gait and mobility: Secondary | ICD-10-CM | POA: Diagnosis not present

## 2015-01-14 DIAGNOSIS — D696 Thrombocytopenia, unspecified: Secondary | ICD-10-CM | POA: Diagnosis not present

## 2015-01-14 DIAGNOSIS — E669 Obesity, unspecified: Secondary | ICD-10-CM | POA: Diagnosis present

## 2015-01-14 DIAGNOSIS — L539 Erythematous condition, unspecified: Secondary | ICD-10-CM

## 2015-01-14 DIAGNOSIS — R011 Cardiac murmur, unspecified: Secondary | ICD-10-CM | POA: Diagnosis not present

## 2015-01-14 DIAGNOSIS — R1311 Dysphagia, oral phase: Secondary | ICD-10-CM | POA: Diagnosis not present

## 2015-01-14 DIAGNOSIS — E780 Pure hypercholesterolemia: Secondary | ICD-10-CM | POA: Diagnosis present

## 2015-01-14 DIAGNOSIS — F29 Unspecified psychosis not due to a substance or known physiological condition: Secondary | ICD-10-CM | POA: Diagnosis not present

## 2015-01-14 DIAGNOSIS — I517 Cardiomegaly: Secondary | ICD-10-CM | POA: Diagnosis not present

## 2015-01-14 DIAGNOSIS — M1990 Unspecified osteoarthritis, unspecified site: Secondary | ICD-10-CM | POA: Diagnosis present

## 2015-01-14 DIAGNOSIS — F0391 Unspecified dementia with behavioral disturbance: Secondary | ICD-10-CM | POA: Diagnosis not present

## 2015-01-14 HISTORY — DX: Cellulitis of left lower limb: L03.116

## 2015-01-14 HISTORY — DX: Other ill-defined heart diseases: I51.89

## 2015-01-14 HISTORY — DX: Chronic kidney disease, stage 3 (moderate): N18.3

## 2015-01-14 LAB — CREATININE, SERUM
Creatinine, Ser: 1.96 mg/dL — ABNORMAL HIGH (ref 0.50–1.10)
GFR calc Af Amer: 26 mL/min — ABNORMAL LOW (ref >90)
GFR, EST NON AFRICAN AMERICAN: 23 mL/min — AB (ref >90)

## 2015-01-14 LAB — COMPREHENSIVE METABOLIC PANEL
ALT: 13 U/L (ref 0–35)
AST: 25 U/L (ref 0–37)
Albumin: 2.9 g/dL — ABNORMAL LOW (ref 3.5–5.2)
Alkaline Phosphatase: 51 U/L (ref 39–117)
Anion gap: 7 (ref 5–15)
BILIRUBIN TOTAL: 0.7 mg/dL (ref 0.3–1.2)
BUN: 66 mg/dL — AB (ref 6–23)
CALCIUM: 7.8 mg/dL — AB (ref 8.4–10.5)
CO2: 27 mmol/L (ref 19–32)
CREATININE: 2.25 mg/dL — AB (ref 0.50–1.10)
Chloride: 103 mmol/L (ref 96–112)
GFR, EST AFRICAN AMERICAN: 22 mL/min — AB (ref >90)
GFR, EST NON AFRICAN AMERICAN: 19 mL/min — AB (ref >90)
Glucose, Bld: 136 mg/dL — ABNORMAL HIGH (ref 70–99)
POTASSIUM: 3.5 mmol/L (ref 3.5–5.1)
SODIUM: 137 mmol/L (ref 135–145)
TOTAL PROTEIN: 5.7 g/dL — AB (ref 6.0–8.3)

## 2015-01-14 LAB — CBC WITH DIFFERENTIAL/PLATELET
Basophils Absolute: 0 10*3/uL (ref 0.0–0.1)
Basophils Relative: 0 % (ref 0–1)
EOS ABS: 0.4 10*3/uL (ref 0.0–0.7)
EOS PCT: 3 % (ref 0–5)
HCT: 37.1 % (ref 36.0–46.0)
Hemoglobin: 12.3 g/dL (ref 12.0–15.0)
LYMPHS ABS: 0.3 10*3/uL — AB (ref 0.7–4.0)
LYMPHS PCT: 2 % — AB (ref 12–46)
MCH: 30.2 pg (ref 26.0–34.0)
MCHC: 33.2 g/dL (ref 30.0–36.0)
MCV: 91.2 fL (ref 78.0–100.0)
MONO ABS: 0.5 10*3/uL (ref 0.1–1.0)
Monocytes Relative: 4 % (ref 3–12)
Neutro Abs: 12.2 10*3/uL — ABNORMAL HIGH (ref 1.7–7.7)
Neutrophils Relative %: 91 % — ABNORMAL HIGH (ref 43–77)
PLATELETS: 135 10*3/uL — AB (ref 150–400)
RBC: 4.07 MIL/uL (ref 3.87–5.11)
RDW: 13.9 % (ref 11.5–15.5)
WBC: 13.4 10*3/uL — ABNORMAL HIGH (ref 4.0–10.5)

## 2015-01-14 LAB — CBC
HCT: 34.9 % — ABNORMAL LOW (ref 36.0–46.0)
HEMOGLOBIN: 11.4 g/dL — AB (ref 12.0–15.0)
MCH: 29.6 pg (ref 26.0–34.0)
MCHC: 32.7 g/dL (ref 30.0–36.0)
MCV: 90.6 fL (ref 78.0–100.0)
Platelets: 132 10*3/uL — ABNORMAL LOW (ref 150–400)
RBC: 3.85 MIL/uL — ABNORMAL LOW (ref 3.87–5.11)
RDW: 13.9 % (ref 11.5–15.5)
WBC: 8.7 10*3/uL (ref 4.0–10.5)

## 2015-01-14 LAB — URINALYSIS, ROUTINE W REFLEX MICROSCOPIC
BILIRUBIN URINE: NEGATIVE
Glucose, UA: NEGATIVE mg/dL
Hgb urine dipstick: NEGATIVE
Ketones, ur: NEGATIVE mg/dL
Leukocytes, UA: NEGATIVE
Nitrite: NEGATIVE
Protein, ur: NEGATIVE mg/dL
Specific Gravity, Urine: 1.01 (ref 1.005–1.030)
Urobilinogen, UA: 0.2 mg/dL (ref 0.0–1.0)
pH: 5.5 (ref 5.0–8.0)

## 2015-01-14 LAB — I-STAT CG4 LACTIC ACID, ED
LACTIC ACID, VENOUS: 1.99 mmol/L (ref 0.5–2.0)
LACTIC ACID, VENOUS: 1 mmol/L (ref 0.5–2.0)

## 2015-01-14 LAB — TROPONIN I

## 2015-01-14 LAB — LACTIC ACID, PLASMA: Lactic Acid, Venous: 1.1 mmol/L (ref 0.5–2.0)

## 2015-01-14 LAB — APTT: APTT: 34 s (ref 24–37)

## 2015-01-14 LAB — MRSA PCR SCREENING: MRSA BY PCR: POSITIVE — AB

## 2015-01-14 LAB — PROTIME-INR
INR: 1.03 (ref 0.00–1.49)
Prothrombin Time: 13.6 seconds (ref 11.6–15.2)

## 2015-01-14 MED ORDER — VANCOMYCIN HCL 500 MG IV SOLR
INTRAVENOUS | Status: AC
  Filled 2015-01-14: qty 500

## 2015-01-14 MED ORDER — VANCOMYCIN HCL IN DEXTROSE 1-5 GM/200ML-% IV SOLN
1000.0000 mg | Freq: Once | INTRAVENOUS | Status: AC
  Administered 2015-01-14: 1000 mg via INTRAVENOUS
  Filled 2015-01-14: qty 200

## 2015-01-14 MED ORDER — ACETAMINOPHEN 325 MG PO TABS: 650.0000 mg | ORAL_TABLET | Freq: Four times a day (QID) | ORAL | Status: DC | PRN

## 2015-01-14 MED ORDER — SODIUM CHLORIDE 0.9 % IV BOLUS (SEPSIS): 1000.0000 mL | Freq: Once | INTRAVENOUS | Status: DC

## 2015-01-14 MED ORDER — PIPERACILLIN-TAZOBACTAM 3.375 G IVPB
INTRAVENOUS | Status: AC
  Filled 2015-01-14: qty 100

## 2015-01-14 MED ORDER — ONDANSETRON HCL 4 MG/2ML IJ SOLN: 4.0000 mg | Freq: Four times a day (QID) | INTRAMUSCULAR | Status: DC | PRN

## 2015-01-14 MED ORDER — SODIUM CHLORIDE 0.9 % IV BOLUS (SEPSIS)
2000.0000 mL | Freq: Once | INTRAVENOUS | Status: AC
  Administered 2015-01-14: 2000 mL via INTRAVENOUS

## 2015-01-14 MED ORDER — ACETAMINOPHEN 650 MG RE SUPP: 650.0000 mg | Freq: Four times a day (QID) | RECTAL | Status: DC | PRN

## 2015-01-14 MED ORDER — SODIUM CHLORIDE 0.9 % IV SOLN
INTRAVENOUS | Status: DC
  Administered 2015-01-14 – 2015-01-16 (×4): via INTRAVENOUS

## 2015-01-14 MED ORDER — MUPIROCIN 2 % EX OINT
1.0000 "application " | TOPICAL_OINTMENT | Freq: Two times a day (BID) | CUTANEOUS | Status: DC
  Administered 2015-01-14 – 2015-01-16 (×5): 1 via NASAL
  Filled 2015-01-14: qty 22

## 2015-01-14 MED ORDER — PIPERACILLIN-TAZOBACTAM 3.375 G IVPB
3.3750 g | Freq: Three times a day (TID) | INTRAVENOUS | Status: DC
  Administered 2015-01-14 – 2015-01-17 (×8): 3.375 g via INTRAVENOUS
  Filled 2015-01-14 (×11): qty 50

## 2015-01-14 MED ORDER — VANCOMYCIN HCL 500 MG IV SOLR
500.0000 mg | Freq: Once | INTRAVENOUS | Status: AC
  Administered 2015-01-14: 500 mg via INTRAVENOUS
  Filled 2015-01-14: qty 500

## 2015-01-14 MED ORDER — CHLORHEXIDINE GLUCONATE CLOTH 2 % EX PADS
6.0000 | MEDICATED_PAD | Freq: Every day | CUTANEOUS | Status: DC
  Administered 2015-01-15 – 2015-01-17 (×3): 6 via TOPICAL

## 2015-01-14 MED ORDER — SODIUM CHLORIDE 0.9 % IV SOLN
INTRAVENOUS | Status: DC
  Administered 2015-01-14: 15:00:00 via INTRAVENOUS

## 2015-01-14 MED ORDER — VANCOMYCIN HCL IN DEXTROSE 1-5 GM/200ML-% IV SOLN
1000.0000 mg | Freq: Once | INTRAVENOUS | Status: DC
  Filled 2015-01-14: qty 200

## 2015-01-14 MED ORDER — PIPERACILLIN-TAZOBACTAM 3.375 G IVPB 30 MIN
3.3750 g | Freq: Once | INTRAVENOUS | Status: DC
  Filled 2015-01-14: qty 50

## 2015-01-14 MED ORDER — HEPARIN SODIUM (PORCINE) 5000 UNIT/ML IJ SOLN
5000.0000 [IU] | Freq: Three times a day (TID) | INTRAMUSCULAR | Status: DC
  Administered 2015-01-14 – 2015-01-17 (×8): 5000 [IU] via SUBCUTANEOUS
  Filled 2015-01-14 (×8): qty 1

## 2015-01-14 MED ORDER — VANCOMYCIN HCL IN DEXTROSE 1-5 GM/200ML-% IV SOLN
1000.0000 mg | INTRAVENOUS | Status: DC
  Administered 2015-01-15 – 2015-01-16 (×2): 1000 mg via INTRAVENOUS
  Filled 2015-01-14 (×2): qty 200

## 2015-01-14 MED ORDER — PIPERACILLIN-TAZOBACTAM 3.375 G IVPB
3.3750 g | Freq: Once | INTRAVENOUS | Status: AC
  Administered 2015-01-14: 3.375 g via INTRAVENOUS
  Filled 2015-01-14: qty 50

## 2015-01-14 MED ORDER — RISPERIDONE 0.5 MG PO TABS
0.5000 mg | ORAL_TABLET | Freq: Every day | ORAL | Status: DC
  Administered 2015-01-14 – 2015-01-16 (×3): 0.5 mg via ORAL
  Filled 2015-01-14 (×3): qty 1

## 2015-01-14 MED ORDER — ONDANSETRON HCL 4 MG PO TABS: 4.0000 mg | ORAL_TABLET | Freq: Four times a day (QID) | ORAL | Status: DC | PRN

## 2015-01-14 NOTE — Progress Notes (Signed)
ANTIBIOTIC CONSULT NOTE - INITIAL  Pharmacy Consult for Vancomycin and Zosyn Indication: rule out sepsis  Allergies  Allergen Reactions  . Macrobid [Nitrofurantoin Monohyd Macro] Rash  . Meloxicam Rash and Other (See Comments)    Reaction is unknown    Patient Measurements: Height: 5\' 4"  (162.6 cm) Weight: 201 lb 1 oz (91.2 kg) IBW/kg (Calculated) : 54.7  Vital Signs: Temp: 97.6 F (36.4 C) (03/27 1743) Temp Source: Oral (03/27 1743) BP: 102/53 mmHg (03/27 1845) Pulse Rate: 81 (03/27 1845) Intake/Output from previous day:   Intake/Output from this shift:    Labs:  Recent Labs  01/14/15 1313  WBC 13.4*  HGB 12.3  PLT 135*  CREATININE 2.25*   Estimated Creatinine Clearance: 21.1 mL/min (by C-G formula based on Cr of 2.25). No results for input(s): VANCOTROUGH, VANCOPEAK, VANCORANDOM, GENTTROUGH, GENTPEAK, GENTRANDOM, TOBRATROUGH, TOBRAPEAK, TOBRARND, AMIKACINPEAK, AMIKACINTROU, AMIKACIN in the last 72 hours.   Microbiology: Recent Results (from the past 720 hour(s))  Urine culture     Status: None   Collection Time: 12/28/14 11:35 AM  Result Value Ref Range Status   Urine Culture, Routine Final report  Final   Result 1 Comment  Final    Comment: Mixed urogenital flora 50,000-100,000 colony forming units per mL   Urine culture     Status: Abnormal   Collection Time: 01/10/15  3:00 PM  Result Value Ref Range Status   Urine Culture, Routine Final report (A)  Final   Result 1 Comment (A)  Final    Comment: Escherichia coli, identified by an automated biochemical system. Greater than 100,000 colony forming units per mL    ANTIMICROBIAL SUSCEPTIBILITY Comment  Final    Comment:       ** S = Susceptible; I = Intermediate; R = Resistant **                    P = Positive; N = Negative             MICS are expressed in micrograms per mL    Antibiotic                 RSLT#1    RSLT#2    RSLT#3    RSLT#4 Amoxicillin/Clavulanic Acid    S Ampicillin                      R Cefepime                       S Ceftriaxone                    S Cefuroxime                     I Cephalothin                    I Ciprofloxacin                  R Ertapenem                      S Gentamicin                     R Imipenem                       S Levofloxacin  R Nitrofurantoin                 S Piperacillin                   R Tetracycline                   R Tobramycin                     I Trimethoprim/Sulfa             R     Medical History: Past Medical History  Diagnosis Date  . Arthritis   . Hypercholesteremia   . Hypertension   . Anxiety    Vancomycin 3/27 >> Zosyn 3/27 >>  Assessment: 81yo female with estimated Normalized clcr ~ 34ml/min.  Admitted with confusion, UTI, and suspected sepsis.  Vancomycin and Zosyn given on admission.  Goal of Therapy:  Vancomycin trough level 15-20 mcg/ml  Plan:  Vancomycin  IV total today (extra  dose ordered) Vancomycin  IV q24hrs Check trough at steady state Zosyn 3.375gm IV q8h, each dose over 4 hrs Monitor labs, renal fxn, and cultures  Valrie Hart A 01/14/2015,7:19 PM

## 2015-01-14 NOTE — ED Notes (Signed)
Patient's daughter reports patient was diagnosed with UTI on Wednesday and patient was placed on Macrobid. Daughter reports patient now has rash and has altered mental status. States she noticed confusion Wednesday, but it has worsened. Also reports patient has fallen recently because she noticed abrasions to elbows.

## 2015-01-14 NOTE — ED Provider Notes (Signed)
This chart was scribed for Sherry Maw Jaasiel Hollyfield, DO by Sherry Fox, ED Scribe. This patient was seen in room APA03/APA03   TIME SEEN: 1:01 PM  CHIEF COMPLAINT: rash, confusion  HPI: Sherry Fox is a 81 y.o. female with history of HTN, hyperlipidemia who presents to the Emergency Department complaining of confusion and rash. Per daughter, she saw her PCP for back pain and dysuria 4 days ago and was diagnosed with UTI and was prescribed Macrobid; she was previously treated with Levaquin for UTI 2 weeks prior. She states the patient seemed to be getting more and more confused over the past few weeks, and then patient's daughter noticed a rash today to arms, face, and back. She noted blood on her clothes and an abrasion on her elbow today, though staff at her nursing home did not report a fall. She denies history of CHF. Patient denies any pain at this time. No known fever. No known vomiting or diarrhea. No cough.  PCP: Sherry Claude, MD, previously Dr. Ander Fox  ROS: Level V caveat for altered mental status  PAST MEDICAL HISTORY/PAST SURGICAL HISTORY:  Past Medical History  Diagnosis Date  . Arthritis   . Hypercholesteremia   . Hypertension   . Anxiety     MEDICATIONS:  Prior to Admission medications   Medication Sig Start Date End Date Taking? Authorizing Provider  cetirizine (ZYRTEC) 10 MG tablet Take 1 tablet (10 mg total) by mouth daily. 02/08/14   Sherry Canter, FNP  citalopram (CELEXA) 20 MG tablet Take 20 mg by mouth daily. anxiety    Historical Provider, MD  cyanocobalamin (,VITAMIN B-12,) 1000 MCG/ML injection Inject one ml IM monthly 05/31/14   Sherry Canter, FNP  lisinopril-hydrochlorothiazide (PRINZIDE,ZESTORETIC) 20-25 MG per tablet Take 1 tablet by mouth daily.    Historical Provider, MD  mirabegron ER (MYRBETRIQ) 25 MG TB24 tablet Take 1 tablet (25 mg total) by mouth daily. 05/31/14   Sherry Canter, FNP  risperiDONE (RISPERDAL) 0.5 MG tablet Take 1 tablet (0.5 mg total) by  mouth at bedtime. 12/28/14   Sherry Claude, MD    ALLERGIES:  Allergies  Allergen Reactions  . Meloxicam Rash and Other (See Comments)    Reaction is unknown    SOCIAL HISTORY:  History  Substance Use Topics  . Smoking status: Never Smoker   . Smokeless tobacco: Not on file  . Alcohol Use: No    FAMILY HISTORY: History reviewed. No pertinent family history.  EXAM: BP 75/49 mmHg  Pulse 93  Temp(Src) 97.6 F (36.4 C) (Oral)  Resp 16  Ht  (1.575 m)  Wt 193 lb (87.544 kg)  BMI 35.29 kg/m2  SpO2 93% CONSTITUTIONAL: Alert; Well-appearing; well-nourished oriented to person and place but not time, elderly, smiling and in no distress, non toxic HEAD: Normocephalic EYES: Conjunctivae clear, PERRL, EOMI ENT: normal nose; no rhinorrhea; moist mucous membranes; pharynx without lesions noted NECK: Supple, no meningismus, no LAD, no midline spinal tenderness or stepoff or deformity CARD: RRR; S1 and S2 appreciated; no murmurs, no clicks, no rubs, no gallops RESP: Normal chest excursion without splinting or tachypnea; breath sounds clear and equal bilaterally; no wheezes, no rhonchi, no rales, no hypoxia or respiratory distress ABD/GI: Normal bowel sounds; non-distended; soft, non-tender, no rebound, no guarding BACK:  The back appears normal and is non-tender to palpation, there is no CVA tenderness. no midline spinal tenderness or stepoff or deformity EXT: Normal ROM in all joints; non-tender to palpation; no edema; normal  capillary refill; no cyanosis    SKIN: Normal color for age and race; warm, no rash, very small superficial abrasion to right elbow, no petechiae or purpura NEURO: Moves all extremities equally, sensation to light touch intact difisely, cranial nerves 2-12 intact, no pronator drift PSYCH: The patient's mood and manner are appropriate. Grooming and personal hygiene are appropriate.   MEDICAL DECISION MAKING: Patient here with reports of altered mental status,  possible fall at her nursing home. She is found to be hypotensive but otherwise vital signs are within normal limits. Given recent diagnosis of urinary tract infection, concern for sepsis. We'll give IV fluids, obtain labs, chest x-ray, urine, cultures. She is not tachycardic, tachypneic or febrile today. Will have low threshold to start broad-spectrum antibiotics. We'll also obtain a CT of her head and cervical spine given possible recent fall. Patient despite having hypotension is well-appearing, smiling, nontoxic, pleasant, appears neurologically intact other than being disoriented to time.  ED PROGRESS: Patient does have a leukocytosis of 13.4 with left shift. We'll give IV vancomycin and Zosyn. Lactate is normal. Blood pressure is improving with IV fluids.  4:00 PM  Pt's blood pressure improving with IV fluids. Chest x-ray shows possible mild interstitial pulmonary edema. She has no known history of CHF but will hydrate cautiously. Urine shows no obvious sign of infection. Discussed with Dr. Kerry Fox with hospitalist service for admission to step down bed. Updated patient and family.  CRITICAL CARE Performed by: Sherry Fox   Total critical care time: 45 minutes  Critical care time was exclusive of separately billable procedures and treating other patients.  Critical care was necessary to treat or prevent imminent or life-threatening deterioration.  Critical care was time spent personally by me on the following activities: development of treatment plan with patient and/or surrogate as well as nursing, discussions with consultants, evaluation of patient's response to treatment, examination of patient, obtaining history from patient or surrogate, ordering and performing treatments and interventions, ordering and review of laboratory studies, ordering and review of radiographic studies, pulse oximetry and re-evaluation of patient's condition.     EKG Interpretation  Date/Time:  Sunday January 14 2015 12:45:08 EDT Ventricular Rate:  85 PR Interval:  182 QRS Duration: 92 QT Interval:  369 QTC Calculation: 439 R Axis:   7 Text Interpretation:  Sinus rhythm Low voltage, extremity and precordial leads Baseline wander in lead(s) V2 No significant change since last tracing Confirmed by ZACKOWSKI  MD, SCOTT [phone]) on 01/14/2015 12:48:48 PM       I personally performed the services described in this documentation, which was scribed in my presence. The recorded information has been reviewed and is accurate.   Sherry Maw Robyn Galati, DO 01/14/15 1609

## 2015-01-14 NOTE — ED Notes (Signed)
Pt in Radiology 

## 2015-01-14 NOTE — H&P (Signed)
[facility] History and Physical  Sherry Fox ZOX:[id number] DOB: [birth year] DOA: 01/14/2015  Referring physician: Dr. Elesa Massed, ER PCP: Mechele Claude, MD   Chief Complaint: confusion  HPI: Sherry Fox is a 81 y.o. female who is a resident of [facility], is brought to the emergency room today with worsening confusion. Patient's daughter reports that approximately 2 weeks ago she had seen her primary care physician and was felt to have a possible urinary tract infection. She was treated with a course of Levaquin. Her symptoms persisted she had a repeat culture done which grew positive Escherichia coli. She is placed on Macrobid a few days ago. At her assisted living, her daughter noticed yesterday that she had broken out in a rash. She is also been increasingly confused over the past week, worse than her baseline. Her daughter reports that at baseline she does have periods of confusion and likely has underlying dementia. She's not had any cough, shortness of breath, diarrhea. She did have one episode of vomiting yesterday. Unclear whether she had any dysuria. On arrival to the emergency room, he was noted that her rash completely cleared. She was noted to have new acute renal failure. She was also noted to be significantly hypotensive and had a mildly elevated WBC count. There was concerns for underlying sepsis and therefore she's been admitted for further treatments   Review of Systems:  Limited due to dementia/confusion  Past Medical History  Diagnosis Date  . Arthritis   . Hypercholesteremia   . Hypertension   . Anxiety    Past Surgical History  Procedure Laterality Date  . Kidney cyst removal     Social History:  reports that she has never smoked. She does not have any smokeless tobacco history on file. She reports that she does not drink alcohol or use illicit drugs.  Allergies  Allergen Reactions  . Meloxicam Rash and Other (See Comments)    Reaction is unknown     Family history: father had diabetes, mother had breast cancer  Prior to Admission medications   Medication Sig Start Date End Date Taking? Authorizing Provider  cetirizine (ZYRTEC) 10 MG tablet Take 1 tablet (10 mg total) by mouth daily. 02/08/14  Yes Deatra Canter, FNP  citalopram (CELEXA) 20 MG tablet Take 20 mg by mouth daily. anxiety   Yes Historical Provider, MD  cyanocobalamin (,VITAMIN B-12,) 1000 MCG/ML injection Inject one ml IM monthly 05/31/14  Yes Deatra Canter, FNP  lisinopril-hydrochlorothiazide (PRINZIDE,ZESTORETIC) 20-25 MG per tablet Take 1 tablet by mouth daily.   Yes Historical Provider, MD  mirabegron ER (MYRBETRIQ) 25 MG TB24 tablet Take 1 tablet (25 mg total) by mouth daily. 05/31/14  Yes Deatra Canter, FNP  risperiDONE (RISPERDAL) 0.5 MG tablet Take 1 tablet (0.5 mg total) by mouth at bedtime. 12/28/14  Yes Mechele Claude, MD   Physical Exam: Filed Vitals:   01/14/15 1600 01/14/15 1615 01/14/15 1645 01/14/15 1649  BP:   Pulse: 82 85 83   Temp:    97.3 F (36.3 C)  TempSrc:    Oral  Resp: Height:      Weight:      SpO2: 92% 94% 95%     Wt Readings from Last 3 Encounters:  01/14/15 87.544 kg (193 lb)  01/10/15 87.726 kg (193 lb 6.4 oz)  12/28/14 86.637 kg (191 lb)    General:  Appears calm and comfortable Eyes: PERRL, normal lids, irises &  conjunctiva ENT: grossly normal hearing, lips & tongue Neck: no LAD, masses or thyromegaly Cardiovascular: RRR, no m/r/g.  Respiratory: scattered coarse breath sounds but mostly clear, no wheezing. Normal respiratory effort. Abdomen: soft, ntnd Skin: erythema noted over left lower leg Musculoskeletal: grossly normal tone BUE/BLE Psychiatric: grossly normal mood and affect, speech fluent. She is confused Neurologic: grossly non-focal. She is confused          Labs on Admission:  Basic Metabolic Panel:  Recent Labs Lab 01/14/15 1313  NA 137  K 3.5  CL 103  CO2 27    GLUCOSE 136*  BUN 66*  CREATININE 2.25*  CALCIUM 7.8*   Liver Function Tests:  Recent Labs Lab 01/14/15 1313  AST 25  ALT 13  ALKPHOS 51  BILITOT 0.7  PROT 5.7*  ALBUMIN 2.9*   No results for input(s): LIPASE, AMYLASE in the last 168 hours. No results for input(s): AMMONIA in the last 168 hours. CBC:  Recent Labs Lab 01/14/15 1313  WBC 13.4*  NEUTROABS 12.2*  HGB 12.3  HCT 37.1  MCV 91.2  PLT 135*   Cardiac Enzymes:  Recent Labs Lab 01/14/15 1313  TROPONINI <0.03    BNP (last 3 results) No results for input(s): BNP in the last 8760 hours.  ProBNP (last 3 results)  Recent Labs  03/02/14 1835  PROBNP 302.2    CBG: No results for input(s): GLUCAP in the last 168 hours.  Radiological Exams on Admission: Ct Head Wo Contrast  01/14/2015   CLINICAL DATA:  Altered mental status, fall  EXAM: CT HEAD WITHOUT CONTRAST  CT CERVICAL SPINE WITHOUT CONTRAST  TECHNIQUE: Multidetector CT imaging of the head and cervical spine was performed following the standard protocol without intravenous contrast. Multiplanar CT image reconstructions of the cervical spine were also generated.  COMPARISON:  07/27/2014  FINDINGS: CT HEAD FINDINGS  No skull fracture is noted. No intracranial hemorrhage, mass effect or midline shift. No acute cortical infarction. Stable atrophy. Stable periventricular and patchy subcortical chronic white matter disease. No definite acute cortical infarction. No mass lesion is noted on this unenhanced scan. The gray and white-matter differentiation is preserved.  CT CERVICAL SPINE FINDINGS  Axial images of the cervical spine shows no acute fracture or subluxation. Computer processed images shows no acute fracture or subluxation. Mild degenerative changes C1-C2 articulation. There is disc space flattening with mild anterior spurring at C4-C5, C5-C6 and C6-C7 level. Mild posterior spurring noted at C5-C6 and C6-C7 level. No prevertebral soft tissue swelling.  Cervical airway is patent.  There is no pneumothorax in visualized lung apices.  IMPRESSION: 1. No acute intracranial abnormality. Stable atrophy and chronic white matter disease. 2. No cervical spine acute fracture or subluxation. Mild degenerative changes as described above.   Electronically Signed   By: Natasha Mead M.D.   On: 01/14/2015 15:05   Ct Cervical Spine Wo Contrast  01/14/2015   CLINICAL DATA:  Altered mental status, fall  EXAM: CT HEAD WITHOUT CONTRAST  CT CERVICAL SPINE WITHOUT CONTRAST  TECHNIQUE: Multidetector CT imaging of the head and cervical spine was performed following the standard protocol without intravenous contrast. Multiplanar CT image reconstructions of the cervical spine were also generated.  COMPARISON:  07/27/2014  FINDINGS: CT HEAD FINDINGS  No skull fracture is noted. No intracranial hemorrhage, mass effect or midline shift. No acute cortical infarction. Stable atrophy. Stable periventricular and patchy subcortical chronic white matter disease. No definite acute cortical infarction. No mass lesion is noted on this unenhanced  scan. The gray and white-matter differentiation is preserved.  CT CERVICAL SPINE FINDINGS  Axial images of the cervical spine shows no acute fracture or subluxation. Computer processed images shows no acute fracture or subluxation. Mild degenerative changes C1-C2 articulation. There is disc space flattening with mild anterior spurring at C4-C5, C5-C6 and C6-C7 level. Mild posterior spurring noted at C5-C6 and C6-C7 level. No prevertebral soft tissue swelling. Cervical airway is patent.  There is no pneumothorax in visualized lung apices.  IMPRESSION: 1. No acute intracranial abnormality. Stable atrophy and chronic white matter disease. 2. No cervical spine acute fracture or subluxation. Mild degenerative changes as described above.   Electronically Signed   By: Natasha Mead M.D.   On: 01/14/2015 15:05   Dg Chest Port 1 View  01/14/2015   CLINICAL DATA:   Altered mental status, currently being treated for urinary tract infection  EXAM: PORTABLE CHEST - 1 VIEW  COMPARISON:  03/02/2014  FINDINGS: Heart size is mildly enlarged. Center vascular congestion noted. Diffusely prominent interstitial markings are identified. Lungs are hypoaerated with crowding of the bronchovascular markings.  IMPRESSION: Mild cardiomegaly with diffusely prominent interstitial markings which could indicate developing interstitial edema. This would be better assessed at PA and lateral chest radiographs performed at full inspiration when the patient is clinically able.   Electronically Signed   By: Christiana Pellant M.D.   On: 01/14/2015 14:08    EKG: Independently reviewed. No acute findings  Assessment/Plan Active Problems:   Hypotension   Sepsis   Cellulitis of left leg   Dementia   Encephalopathy   Acute renal failure   1. Sepsis. Etiologies not entirely clear, although may be related to possible cellulitis of the left lower leg. She has received 2 L of fluid in the emergency room with improvement of her blood pressure. Will continue hydration. Continue broad-spectrum antibiotics. Follow-up cultures. 2. Possible cellulitis of left lower leg. Continue with antibiotics. Check venous Doppler to rule out underlying DVT 3. Acute renal failure. Likely prerenal related to hypotension/volume depletion. Patient is also on lisinopril-hydrochlorothiazide prior to admission. These will be held. Continue IV fluids and recheck labs in the morning. If no improvement, consider further workup. 4. Encephalopathy, superimposed on baseline dementia. Likely related to sepsis. Continue to follow 5. Possible interstitial edema. Chest x-ray indicated possible interstitial edema although she does not appear short of breath. Will check echocardiogram. Continue hydration with caution. 6. Hypertension. Blood pressure is currently low. Hold antihypertensives.  Code Status: DNR DVT Prophylaxis:  heparin sq Family Communication: discussed with daughter at the bedside Disposition Plan: daughter does not want patient to return to [facility]. Will request physical therapy and social work consults.  Time spent:  [facility] Pager [phone]

## 2015-01-14 NOTE — ED Notes (Signed)
Hospitalist in room with pt. Awaiting consult to finish for transfer.

## 2015-01-15 ENCOUNTER — Inpatient Hospital Stay (HOSPITAL_COMMUNITY): Payer: Medicare Other

## 2015-01-15 DIAGNOSIS — N183 Chronic kidney disease, stage 3 unspecified: Secondary | ICD-10-CM

## 2015-01-15 DIAGNOSIS — R21 Rash and other nonspecific skin eruption: Secondary | ICD-10-CM | POA: Diagnosis present

## 2015-01-15 HISTORY — DX: Chronic kidney disease, stage 3 unspecified: N18.30

## 2015-01-15 LAB — CBC
HEMATOCRIT: 33.3 % — AB (ref 36.0–46.0)
Hemoglobin: 10.8 g/dL — ABNORMAL LOW (ref 12.0–15.0)
MCH: 29.3 pg (ref 26.0–34.0)
MCHC: 32.4 g/dL (ref 30.0–36.0)
MCV: 90.2 fL (ref 78.0–100.0)
Platelets: 128 10*3/uL — ABNORMAL LOW (ref 150–400)
RBC: 3.69 MIL/uL — ABNORMAL LOW (ref 3.87–5.11)
RDW: 13.9 % (ref 11.5–15.5)
WBC: 6.5 10*3/uL (ref 4.0–10.5)

## 2015-01-15 LAB — PROCALCITONIN: Procalcitonin: 1.02 ng/mL

## 2015-01-15 LAB — BASIC METABOLIC PANEL
Anion gap: 6 (ref 5–15)
BUN: 44 mg/dL — ABNORMAL HIGH (ref 6–23)
CO2: 25 mmol/L (ref 19–32)
CREATININE: 1.74 mg/dL — AB (ref 0.50–1.10)
Calcium: 7.6 mg/dL — ABNORMAL LOW (ref 8.4–10.5)
Chloride: 109 mmol/L (ref 96–112)
GFR calc non Af Amer: 26 mL/min — ABNORMAL LOW (ref >90)
GFR, EST AFRICAN AMERICAN: 30 mL/min — AB (ref >90)
Glucose, Bld: 153 mg/dL — ABNORMAL HIGH (ref 70–99)
POTASSIUM: 3.5 mmol/L (ref 3.5–5.1)
SODIUM: 140 mmol/L (ref 135–145)

## 2015-01-15 LAB — COMPREHENSIVE METABOLIC PANEL
ALT: 13 U/L (ref 0–35)
AST: 22 U/L (ref 0–37)
Albumin: 2.6 g/dL — ABNORMAL LOW (ref 3.5–5.2)
Alkaline Phosphatase: 44 U/L (ref 39–117)
Anion gap: 8 (ref 5–15)
BUN: 50 mg/dL — ABNORMAL HIGH (ref 6–23)
CALCIUM: 7.5 mg/dL — AB (ref 8.4–10.5)
CO2: 23 mmol/L (ref 19–32)
Chloride: 108 mmol/L (ref 96–112)
Creatinine, Ser: 1.66 mg/dL — ABNORMAL HIGH (ref 0.50–1.10)
GFR calc non Af Amer: 28 mL/min — ABNORMAL LOW (ref >90)
GFR, EST AFRICAN AMERICAN: 32 mL/min — AB (ref >90)
Glucose, Bld: 101 mg/dL — ABNORMAL HIGH (ref 70–99)
Potassium: 2.9 mmol/L — ABNORMAL LOW (ref 3.5–5.1)
Sodium: 139 mmol/L (ref 135–145)
TOTAL PROTEIN: 5.4 g/dL — AB (ref 6.0–8.3)
Total Bilirubin: 0.7 mg/dL (ref 0.3–1.2)

## 2015-01-15 LAB — URINE CULTURE
COLONY COUNT: NO GROWTH
Culture: NO GROWTH

## 2015-01-15 LAB — TROPONIN I: Troponin I: <0.03 ng/mL (ref <0.031)

## 2015-01-15 LAB — MAGNESIUM: Magnesium: 2 mg/dL (ref 1.5–2.5)

## 2015-01-15 LAB — LACTIC ACID, PLASMA: Lactic Acid, Venous: 1.2 mmol/L (ref 0.5–2.0)

## 2015-01-15 MED ORDER — PIPERACILLIN-TAZOBACTAM 3.375 G IVPB
INTRAVENOUS | Status: AC
  Filled 2015-01-15: qty 100

## 2015-01-15 MED ORDER — SODIUM CHLORIDE 0.9 % IV BOLUS (SEPSIS)
500.0000 mL | Freq: Once | INTRAVENOUS | Status: AC
  Administered 2015-01-15: 500 mL via INTRAVENOUS

## 2015-01-15 MED ORDER — POTASSIUM CHLORIDE 10 MEQ/100ML IV SOLN
10.0000 meq | INTRAVENOUS | Status: AC
  Administered 2015-01-15 (×3): 10 meq via INTRAVENOUS
  Filled 2015-01-15 (×3): qty 100

## 2015-01-15 NOTE — Clinical Social Work Psychosocial (Signed)
Clinical Social Work Department BRIEF PSYCHOSOCIAL ASSESSMENT 01/15/2015  Patient:  JOELLE, ROSWELL     Account Number:  [id number]     Admit date:  01/14/2015  Clinical Social Worker:  Legrand Como  Date/Time:  01/15/2015 01:33 PM  Referred by:  CSW  Date Referred:  01/15/2015 Referred for  ALF Placement   Other Referral:   Interview type:  Family Other interview type:   Daughter, Karl Pock    PSYCHOSOCIAL DATA Living Status:  FACILITY Admitted from facility:  Other Level of care:  Assisted Living Primary support name:  Karl Pock Primary support relationship to patient:  CHILD, ADULT Degree of support available:   Daughter is very supportive and visits daily.    CURRENT CONCERNS Current Concerns  Post-Acute Placement   Other Concerns:    SOCIAL WORK ASSESSMENT / PLAN CSW met with patient who was alert and oriented to self. Patient's daughter, Karl Pock, was at bedside feeding patient.  Mrs. Mila Palmer indicated that patient has been at [facility] for nine months.  She stated that patient uses a walker to assist with ambulating. Mrs. Mila Palmer indicated visits daily and assist patient with bathing when she arrives.  She stated that patient seems depressed since a lot of her friends at the facility have passed away.  Mrs. Mila Palmer indicated that she was interested in seeking admission to [facility] as patient's current placement was costing patient's entire check and two hundred dollars additionally (that she pays out of her pocket).  CSW advised that [facility] was a SNF and that patient would have to have a skilled need for admission.  CSW advised that she would speak to patient's CM related to a PT consult to assess for skilled need.  CSW spoke with Mia at [facility].  Mia confirmed Mrs. Vaden's statements.  She indicated that patient could return to the facility. Mia stated that she was preparing to speak with Mrs. Vaden about moving patient to the Special Care Unit due to  her dementia.  She requested that CSW not mention this to Mrs. Vaden as she wanted to speak to her about it first. CSW agreed.  She indicated that patient could return to the facility.   Assessment/plan status:  Information/Referral to [employer] Other assessment/ plan:   Information/referral to community resources:    PATIENT'S/FAMILY'S RESPONSE TO PLAN OF CARE: Patient's daughter would like for patient to be discharged to SNF [facility]).  Mia at [facility] indicates that patient can return to the facility upon discharge.    Ambrose Pancoast, [place]

## 2015-01-15 NOTE — Progress Notes (Signed)
[facility] PROGRESS NOTE  Sherry Fox ZOX:[id number] DOB: [birth year] DOA: 01/14/2015 PCP: Mechele Claude, MD  Assessment/Plan: 1. Sepsis. Possibly related to cellulitis. Continue vancomycin and zosyn. Follow up cultures. Lactic acid normal 2. Cellulitis of left lower leg. Continue antibiotics. Elevate leg. Venous dopplers ordered to rule out DVT 3. Acute renal failure. Pre-renal related to dehydration. Improving with IV fluids. Continue current treatments. 4. Encephalopathy. Appears to be approaching near baseline, likely has underlying dementia. Also suspect that she has underlying parkinsons, although she is not on any meds. Will need further evaluation as an outpatient. 5. Possible interstitial edema. Patient does not complain of shortness of breath. Echocardiogram has been ordered. 6. Hypertension. Blood pressure still running on lower side. Continue to hold lisinopril/HCTZ.  Code Status: DNR Family Communication: discussed with patient, no family present Disposition Plan: to be determined. Transfer telemetry   Consultants:    Procedures:    Antibiotics:  Vancomycin 3/27>>  Zosyn 3/27>>  HPI/Subjective: Overall feeling better. No shortness of breath. Still confused  Objective: Filed Vitals:   01/15/15 0630  BP: 97/42  Pulse: 83  Temp:   Resp: 16    Intake/Output Summary (Last 24 hours) at 01/15/15 0904 Last data filed at 01/15/15 8119  Gross per 24 hour  Intake 5765.42 ml  Output   1725 ml  Net 4040.42 ml   Filed Weights   01/14/15 1226 01/14/15 1743 01/15/15 0500  Weight: 87.544 kg (193 lb) 91.2 kg (201 lb 1 oz) 91.7 kg (202 lb 2.6 oz)    Exam:   General:  NAD  Cardiovascular: S1, s2 RRR  Respiratory: crackles at bases  Abdomen: soft, nt, nd, bs+  Musculoskeletal: LLE more edematous than right and is warm, overall erythema improving  Skin: diffuse petechial rash on bilateral upper thighs and back   Data Reviewed: Basic Metabolic  Panel:  Recent Labs Lab 01/14/15 1313 01/14/15 2026 01/15/15 0457 01/15/15 0600  NA 137  --  139  --   K 3.5  --  2.9*  --   CL 103  --  108  --   CO2 27  --  23  --   GLUCOSE 136*  --  101*  --   BUN 66*  --  50*  --   CREATININE 2.25* 1.96* 1.66*  --   CALCIUM 7.8*  --  7.5*  --   MG  --   --   --  2.0   Liver Function Tests:  Recent Labs Lab 01/14/15 1313 01/15/15 0457  AST 25 22  ALT 13 13  ALKPHOS 51 44  BILITOT 0.7 0.7  PROT 5.7* 5.4*  ALBUMIN 2.9* 2.6*   No results for input(s): LIPASE, AMYLASE in the last 168 hours. No results for input(s): AMMONIA in the last 168 hours. CBC:  Recent Labs Lab 01/14/15 1313 01/14/15 2026 01/15/15 0457  WBC 13.4* 8.7 6.5  NEUTROABS 12.2*  --   --   HGB 12.3 11.4* 10.8*  HCT 37.1 34.9* 33.3*  MCV 91.2 90.6 90.2  PLT 135* 132* 128*   Cardiac Enzymes:  Recent Labs Lab 01/14/15 1313 01/14/15 2027 01/15/15 0027 01/15/15 0600  TROPONINI <0.03 <0.03 <0.03 <0.03   BNP (last 3 results) No results for input(s): BNP in the last 8760 hours.  ProBNP (last 3 results)  Recent Labs  03/02/14 1835  PROBNP 302.2    CBG: No results for input(s): GLUCAP in the last 168 hours.  Recent Results (from the past 240 hour(s))  Urine culture     Status: Abnormal   Collection Time: 01/10/15  3:00 PM  Result Value Ref Range Status   Urine Culture, Routine Final report (A)  Final   Result 1 Comment (A)  Final    Comment: Escherichia coli, identified by an automated biochemical system. Greater than 100,000 colony forming units per mL    ANTIMICROBIAL SUSCEPTIBILITY Comment  Final    Comment:       ** S = Susceptible; I = Intermediate; R = Resistant **                    P = Positive; N = Negative             MICS are expressed in micrograms per mL    Antibiotic                 RSLT#1    RSLT#2    RSLT#3    RSLT#4 Amoxicillin/Clavulanic Acid    S Ampicillin                     R Cefepime                       S Ceftriaxone                     S Cefuroxime                     I Cephalothin                    I Ciprofloxacin                  R Ertapenem                      S Gentamicin                     R Imipenem                       S Levofloxacin                   R Nitrofurantoin                 S Piperacillin                   R Tetracycline                   R Tobramycin                     I Trimethoprim/Sulfa             R   MRSA PCR Screening     Status: Abnormal   Collection Time: 01/14/15  7:30 PM  Result Value Ref Range Status   MRSA by PCR POSITIVE (A) NEGATIVE Final    Comment:        The GeneXpert MRSA Assay (FDA approved for NASAL specimens only), is one component of a comprehensive MRSA colonization surveillance program. It is not intended to diagnose MRSA infection nor to guide or monitor treatment for MRSA infections. RESULT CALLED TO, READ BACK BY AND VERIFIED WITH: NELSON,T AT 2110 ON 01/14/15 BY MOSLEY,J      Studies: Ct Head Wo Contrast  01/14/2015   CLINICAL DATA:  Altered mental status, fall  EXAM: CT HEAD WITHOUT CONTRAST  CT CERVICAL SPINE WITHOUT CONTRAST  TECHNIQUE: Multidetector CT imaging of the head and cervical spine was performed following the standard protocol without intravenous contrast. Multiplanar CT image reconstructions of the cervical spine were also generated.  COMPARISON:  07/27/2014  FINDINGS: CT HEAD FINDINGS  No skull fracture is noted. No intracranial hemorrhage, mass effect or midline shift. No acute cortical infarction. Stable atrophy. Stable periventricular and patchy subcortical chronic white matter disease. No definite acute cortical infarction. No mass lesion is noted on this unenhanced scan. The gray and white-matter differentiation is preserved.  CT CERVICAL SPINE FINDINGS  Axial images of the cervical spine shows no acute fracture or subluxation. Computer processed images shows no acute fracture or subluxation. Mild degenerative changes C1-C2  articulation. There is disc space flattening with mild anterior spurring at C4-C5, C5-C6 and C6-C7 level. Mild posterior spurring noted at C5-C6 and C6-C7 level. No prevertebral soft tissue swelling. Cervical airway is patent.  There is no pneumothorax in visualized lung apices.  IMPRESSION: 1. No acute intracranial abnormality. Stable atrophy and chronic white matter disease. 2. No cervical spine acute fracture or subluxation. Mild degenerative changes as described above.   Electronically Signed   By: Natasha Mead M.D.   On: 01/14/2015 15:05   Ct Cervical Spine Wo Contrast  01/14/2015   CLINICAL DATA:  Altered mental status, fall  EXAM: CT HEAD WITHOUT CONTRAST  CT CERVICAL SPINE WITHOUT CONTRAST  TECHNIQUE: Multidetector CT imaging of the head and cervical spine was performed following the standard protocol without intravenous contrast. Multiplanar CT image reconstructions of the cervical spine were also generated.  COMPARISON:  07/27/2014  FINDINGS: CT HEAD FINDINGS  No skull fracture is noted. No intracranial hemorrhage, mass effect or midline shift. No acute cortical infarction. Stable atrophy. Stable periventricular and patchy subcortical chronic white matter disease. No definite acute cortical infarction. No mass lesion is noted on this unenhanced scan. The gray and white-matter differentiation is preserved.  CT CERVICAL SPINE FINDINGS  Axial images of the cervical spine shows no acute fracture or subluxation. Computer processed images shows no acute fracture or subluxation. Mild degenerative changes C1-C2 articulation. There is disc space flattening with mild anterior spurring at C4-C5, C5-C6 and C6-C7 level. Mild posterior spurring noted at C5-C6 and C6-C7 level. No prevertebral soft tissue swelling. Cervical airway is patent.  There is no pneumothorax in visualized lung apices.  IMPRESSION: 1. No acute intracranial abnormality. Stable atrophy and chronic white matter disease. 2. No cervical spine acute  fracture or subluxation. Mild degenerative changes as described above.   Electronically Signed   By: Natasha Mead M.D.   On: 01/14/2015 15:05   Dg Chest Port 1 View  01/14/2015   CLINICAL DATA:  Altered mental status, currently being treated for urinary tract infection  EXAM: PORTABLE CHEST - 1 VIEW  COMPARISON:  03/02/2014  FINDINGS: Heart size is mildly enlarged. Center vascular congestion noted. Diffusely prominent interstitial markings are identified. Lungs are hypoaerated with crowding of the bronchovascular markings.  IMPRESSION: Mild cardiomegaly with diffusely prominent interstitial markings which could indicate developing interstitial edema. This would be better assessed at PA and lateral chest radiographs performed at full inspiration when the patient is clinically able.   Electronically Signed   By: Christiana Pellant M.D.   On: 01/14/2015 14:08    Scheduled Meds: . Chlorhexidine Gluconate Cloth  6 each Topical Q0600  . heparin  5,000 Units Subcutaneous 3 times  per day  . mupirocin ointment  1 application Nasal BID  . piperacillin-tazobactam (ZOSYN)  IV  3.375 g Intravenous Q8H  . potassium chloride  10 mEq Intravenous Q1 Hr x 3  . risperiDONE  0.5 mg Oral QHS  . vancomycin  1,000 mg Intravenous Q24H   Continuous Infusions: . sodium chloride 100 mL/hr at 01/14/15 2005    Active Problems:   Hypotension   Sepsis   Cellulitis of left leg   Dementia   Encephalopathy   Acute renal failure   CKD (chronic kidney disease) stage 3, GFR 30-59 ml/min   Rash and nonspecific skin eruption    Time spent:    Sherry Fox  [facility] Pager [phone]. If 7PM-7AM, please contact night-coverage at [email or web address], password [facility] 01/15/2015, 9:04 AM  LOS: 1 day

## 2015-01-15 NOTE — Progress Notes (Signed)
  Echocardiogram 2D Echocardiogram has been performed.  Stacey Drain 01/15/2015, 2:13 PM

## 2015-01-15 NOTE — Care Management Utilization Note (Signed)
UR completed 

## 2015-01-15 NOTE — Progress Notes (Signed)
Called report to Cyndia Diver, RN on dept 300.  Verbalized understanding.  Pt transferred to room 331 in safe and stable condition. Schonewitz, Candelaria Stagers 01/15/2015

## 2015-01-15 NOTE — Care Management Note (Addendum)
    Page 1 of 1   01/17/2015     11:53:37 AM CARE MANAGEMENT NOTE 01/17/2015  Patient:  Sherry Fox, Sherry Fox   Account Number:  [id number]  Date Initiated:  01/15/2015  Documentation initiated by:  Kathyrn Sheriff  Subjective/Objective Assessment:   Pt is is from [facility]. Pt wishes to go to [facility] at discharge. CSW is aware of arrangement needs and make arrangments for placement. No CM needs identified at this time.     Action/Plan:   Anticipated DC Date:  01/15/2015   Anticipated DC Plan:  SKILLED NURSING FACILITY  In-house referral  Clinical Social Worker      DC Planning Services  CM consult      Choice offered to / List presented to:             Status of service:  Completed, signed off Medicare Important Message given?  YES (If response is "NO", the following Medicare IM given date fields will be blank) Date Medicare IM given:  01/17/2015 Medicare IM given by:  Sharrie Rothman Date Additional Medicare IM given:   Additional Medicare IM given by:    Discharge Disposition:  SKILLED NURSING FACILITY  Per UR Regulation:  Reviewed for med. necessity/level of care/duration of stay  If discussed at Long Length of Stay Meetings, dates discussed:    Comments:  01/17/15 1150 Arlyss Queen, RN BSN CM Pt discharged to [facility] today. CSW to arrange discharge to facility.  01/15/2015 1450 Kathyrn Sheriff, RN, MSN, CM

## 2015-01-16 MED ORDER — HALOPERIDOL LACTATE 5 MG/ML IJ SOLN
5.0000 mg | Freq: Four times a day (QID) | INTRAMUSCULAR | Status: DC | PRN
  Administered 2015-01-16: 5 mg via INTRAVENOUS
  Filled 2015-01-16 (×2): qty 1

## 2015-01-16 NOTE — Evaluation (Signed)
Physical Therapy Evaluation Patient Details Name: Sherry Fox MRN: [id number] DOB: 1930-07-30 Today's Date: 01/16/2015   History of Present Illness  HPI: Sherry Fox is a 81 y.o. female who is a resident of [facility], is brought to the emergency room today with worsening confusion. Patient's daughter reports that approximately 2 weeks ago she had seen her primary care physician and was felt to have a possible urinary tract infection. She was treated with a course of Levaquin. Her symptoms persisted she had a repeat culture done which grew positive Escherichia coli. She is placed on Macrobid a few days ago. At her assisted living, her daughter noticed yesterday that she had broken out in a rash. She is also been increasingly confused over the past week, worse than her baseline. Her daughter reports that at baseline she does have periods of confusion and likely has underlying dementia. She's not had any cough, shortness of breath, diarrhea. She did have one episode of vomiting yesterday. Unclear whether she had any dysuria. On arrival to the emergency room, he was noted that her rash completely cleared. She was noted to have new acute renal failure. She was also noted to be significantly hypotensive and had a mildly elevated WBC count. There was concerns for underlying sepsis and therefore she's been admitted for further treatments  Clinical Impression   Pt was seen for evaluation.  She was alert and cooperative, oriented to self and place.  She has a very flat affect but able to follow 75% of instruction.  She is found to have weakness in her LEs and gait with a walker is unstable, needing very close guarding and guidance.  Her standing balance is only fair even with a walker and her risk of fall is high.  It would be beneficial for her to go to SNF for a trial of rehab to see if this would help to reduce her risk of falls.    Follow Up Recommendations SNF    Equipment  Recommendations  None recommended by PT    Recommendations for Other Services   none    Precautions / Restrictions Precautions Precautions: Fall Restrictions Weight Bearing Restrictions: No      Mobility  Bed Mobility Overal bed mobility: Needs Assistance Bed Mobility: Supine to Sit     Supine to sit: Mod assist        Transfers Overall transfer level: Needs assistance Equipment used: Rolling walker (2 wheeled) Transfers: Sit to/from Stand Sit to Stand: Min assist         General transfer comment: needs instruction for hand placement to assist standing...she needs instruction to shift weight anteriorly to facilitate standing and then needs manual assist to prevent falling backward  Ambulation/Gait Ambulation/Gait assistance: Min guard Ambulation Distance (Feet): 60 Feet Assistive device: Rolling walker (2 wheeled) Gait Pattern/deviations: Trunk flexed;Drifts right/left;Shuffle   Gait velocity interpretation: at or above normal speed for age/gender General Gait Details: needs assist to guide the walker                      Balance Overall balance assessment: Needs assistance Sitting-balance support: No upper extremity supported;Feet supported Sitting balance-Leahy Scale: Good     Standing balance support: Bilateral upper extremity supported;During functional activity Standing balance-Leahy Scale: Fair                               Pertinent Vitals/Pain Pain Assessment:  No/denies pain    Home Living Family/patient expects to be discharged to:: Skilled nursing facility                      Prior Function           Comments: unknown...pt states that she ambulated with a walker and this is probably correct as [facility] usually will not accept unless pts are ambulatory             Extremity/Trunk Assessment   Upper Extremity Assessment: Overall WFL for tasks assessed           Lower Extremity Assessment:  Generalized weakness (strength is 3/5 but pt is obese and strength is not adequate to support her body weight )         Communication   Communication: No difficulties  Cognition Arousal/Alertness: Awake/alert Behavior During Therapy: Flat affect Overall Cognitive Status: History of cognitive impairments - at baseline                                    Assessment/Plan    PT Assessment All further PT needs can be met in the next venue of care  PT Diagnosis Difficulty walking;Generalized weakness   PT Problem List Decreased strength;Decreased activity tolerance;Decreased mobility;Decreased cognition;Obesity  PT Treatment Interventions     PT Goals (Current goals can be found in the Care Plan section) Acute Rehab PT Goals PT Goal Formulation: All assessment and education complete, DC therapy         Barriers to discharge  none                     End of Session Equipment Utilized During Treatment: Gait belt Activity Tolerance: Patient tolerated treatment well Patient left: in chair;with call bell/phone within reach;with chair alarm set Nurse Communication: Mobility status         Time: 1586-8257 PT Time Calculation (min) (ACUTE ONLY): 31 min   Charges:   PT Evaluation $Initial PT Evaluation Tier I: 1 Procedure     PT G CodesDemetrios Isaacs L 01/16/2015, 9:06 AM

## 2015-01-16 NOTE — Clinical Social Work Note (Signed)
CSW received bed offer from [facility] at [facility]. CSW made bed offer to patient and patient's daughter. Patient and daughter accepted the bed offer.  CSW notified Tresa Endo at [facility] that patient had accepted bed offer and would likely be discharged tomorrow.     Tretha Sciara, [place] [phone]
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
## 2015-01-16 NOTE — Progress Notes (Addendum)
[facility] PROGRESS NOTE  Sherry Fox ZOX:[id number] DOB: [birth year] DOA: 01/14/2015 PCP: Mechele Claude, MD  Assessment/Plan: 1. Sepsis. Possibly related to cellulitis. Continue intravenous antibiotics. Follow up cultures. Lactic acid normal 2. Cellulitis of left lower leg. Continue antibiotics. Elevate leg. Venous dopplers negative for DVT. Improving, possible transition to oral antibiotics tomorrow. 3. Acute renal failure on CKD stage 3. Pre-renal related to dehydration/ACEi. Initially was improving with IV fluids, but creatinine is mildly increased today. Will continue with IV fluids and check renal ultrasound. Will hold vancomycin for now. 4. Encephalopathy. Appears to be approaching near baseline, likely has underlying dementia. She is more confused in the hospital, likely related to change in environment. 5. Possible interstitial edema on chest xray. Patient does not complain of shortness of breath and does not have any hypoxia. Echocardiogram has been done, await report. Repeat 2 view chest xray in am 6. Hypertension. Blood pressure still running on lower side. Continue to hold lisinopril/HCTZ. 7. Possible Parkinsons disease. Patient has "pill rolling" tremor in right hand, shuffling gait and masked facies. Her daughter also has parkinsons. She would likely benefit from an outpatient neurology evaluation.  Code Status: DNR Family Communication: discussed with patient, no family present Disposition Plan: skilled nursing facility placement.   Consultants:    Procedures:    Antibiotics:  Vancomycin 3/27>>  Zosyn 3/27>>  HPI/Subjective: Confused. No complaints. No shortness of breath. No abdominal pain  Objective: Filed Vitals:   01/16/15 1431  BP: 112/55  Pulse: 67  Temp: 98.6 F (37 C)  Resp: 20    Intake/Output Summary (Last 24 hours) at 01/16/15 1638 Last data filed at 01/16/15 1636  Gross per 24 hour  Intake 1948.33 ml  Output   1000 ml  Net 948.33 ml    Filed Weights   01/14/15 1743 01/15/15 0500 01/16/15 0500  Weight: 91.2 kg (201 lb 1 oz) 91.7 kg (202 lb 2.6 oz) 92 kg (202 lb 13.2 oz)    Exam:   General:  NAD  Cardiovascular: S1, s2 RRR  Respiratory: crackles at bases  Abdomen: soft, nt, nd, bs+  Musculoskeletal: LLE erythema and edema improving.  Skin: diffuse petechial rash on bilateral upper thighs and back, improving   Data Reviewed: Basic Metabolic Panel:  Recent Labs Lab 01/14/15 1313 01/14/15 2026 01/15/15 0457 01/15/15 0600 01/15/15 1503  NA 137  --  139  --  140  K 3.5  --  2.9*  --  3.5  CL 103  --  108  --  109  CO2 27  --  23  --  25  GLUCOSE 136*  --  101*  --  153*  BUN 66*  --  50*  --  44*  CREATININE 2.25* 1.96* 1.66*  --  1.74*  CALCIUM 7.8*  --  7.5*  --  7.6*  MG  --   --   --  2.0  --    Liver Function Tests:  Recent Labs Lab 01/14/15 1313 01/15/15 0457  AST 25 22  ALT 13 13  ALKPHOS 51 44  BILITOT 0.7 0.7  PROT 5.7* 5.4*  ALBUMIN 2.9* 2.6*   No results for input(s): LIPASE, AMYLASE in the last 168 hours. No results for input(s): AMMONIA in the last 168 hours. CBC:  Recent Labs Lab 01/14/15 1313 01/14/15 2026 01/15/15 0457  WBC 13.4* 8.7 6.5  NEUTROABS 12.2*  --   --   HGB 12.3 11.4* 10.8*  HCT 37.1 34.9* 33.3*  MCV 91.2 90.6  90.2  PLT 135* 132* 128*   Cardiac Enzymes:  Recent Labs Lab 01/14/15 1313 01/14/15 2027 01/15/15 0027 01/15/15 0600  TROPONINI <0.03 <0.03 <0.03 <0.03   BNP (last 3 results) No results for input(s): BNP in the last 8760 hours.  ProBNP (last 3 results)  Recent Labs  03/02/14 1835  PROBNP 302.2    CBG: No results for input(s): GLUCAP in the last 168 hours.  Recent Results (from the past 240 hour(s))  Urine culture     Status: Abnormal   Collection Time: 01/10/15  3:00 PM  Result Value Ref Range Status   Urine Culture, Routine Final report (A)  Final   Result 1 Comment (A)  Final    Comment: Escherichia coli, identified  by an automated biochemical system. Greater than 100,000 colony forming units per mL    ANTIMICROBIAL SUSCEPTIBILITY Comment  Final    Comment:       ** S = Susceptible; I = Intermediate; R = Resistant **                    P = Positive; N = Negative             MICS are expressed in micrograms per mL    Antibiotic                 RSLT#1    RSLT#2    RSLT#3    RSLT#4 Amoxicillin/Clavulanic Acid    S Ampicillin                     R Cefepime                       S Ceftriaxone                    S Cefuroxime                     I Cephalothin                    I Ciprofloxacin                  R Ertapenem                      S Gentamicin                     R Imipenem                       S Levofloxacin                   R Nitrofurantoin                 S Piperacillin                   R Tetracycline                   R Tobramycin                     I Trimethoprim/Sulfa             R   Blood Culture (routine x 2)     Status: None (Preliminary result)   Collection Time: 01/14/15  1:13 PM  Result Value Ref Range Status   Specimen Description LEFT  ANTECUBITAL  Final   Special Requests BOTTLES DRAWN AEROBIC AND ANAEROBIC 6CC  Final   Culture NO GROWTH 1 DAY  Final   Report Status PENDING  Incomplete  Blood Culture (routine x 2)     Status: None (Preliminary result)   Collection Time: 01/14/15  1:22 PM  Result Value Ref Range Status   Specimen Description BLOOD RIGHT FOREARM  Final   Special Requests BOTTLES DRAWN AEROBIC AND ANAEROBIC 6CC  Final   Culture NO GROWTH 1 DAY  Final   Report Status PENDING  Incomplete  Urine culture     Status: None   Collection Time: 01/14/15  3:35 PM  Result Value Ref Range Status   Specimen Description URINE, CATHETERIZED  Final   Special Requests NONE  Final   Colony Count NO GROWTH Performed at [facility]   Final   Culture NO GROWTH Performed at [facility]   Final   Report Status 01/15/2015 FINAL  Final  MRSA PCR  Screening     Status: Abnormal   Collection Time: 01/14/15  7:30 PM  Result Value Ref Range Status   MRSA by PCR POSITIVE (A) NEGATIVE Final    Comment:        The GeneXpert MRSA Assay (FDA approved for NASAL specimens only), is one component of a comprehensive MRSA colonization surveillance program. It is not intended to diagnose MRSA infection nor to guide or monitor treatment for MRSA infections. RESULT CALLED TO, READ BACK BY AND VERIFIED WITH: NELSON,T AT 2110 ON 01/14/15 BY MOSLEY,J      Studies: US Venous Img Lower Unilateral Left  01/15/2015   CLINICAL DATA:  81 year old female with left lower extremity erythema, swelling and cellulitis  EXAM: LEFT LOWER EXTREMITY VENOUS DOPPLER ULTRASOUND  TECHNIQUE: Gray-scale sonography with graded compression, as well as color Doppler and duplex ultrasound were performed to evaluate the lower extremity deep venous systems from the level of the common femoral vein and including the common femoral, femoral, profunda femoral, popliteal and calf veins including the posterior tibial, peroneal and gastrocnemius veins when visible. The superficial great saphenous vein was also interrogated. Spectral Doppler was utilized to evaluate flow at rest and with distal augmentation maneuvers in the common femoral, femoral and popliteal veins.  COMPARISON:  None.  FINDINGS: Contralateral Common Femoral Vein: Respiratory phasicity is normal and symmetric with the symptomatic side. No evidence of thrombus. Normal compressibility.  Common Femoral Vein: No evidence of thrombus. Normal compressibility, respiratory phasicity and response to augmentation.  Saphenofemoral Junction: No evidence of thrombus. Normal compressibility and flow on color Doppler imaging.  Profunda Femoral Vein: No evidence of thrombus. Normal compressibility and flow on color Doppler imaging.  Femoral Vein: No evidence of thrombus. Normal compressibility, respiratory phasicity and response to  augmentation.  Popliteal Vein: No evidence of thrombus. Normal compressibility, respiratory phasicity and response to augmentation.  Calf Veins: No evidence of thrombus. Normal compressibility and flow on color Doppler imaging.  Superficial Great Saphenous Vein: No evidence of thrombus. Normal compressibility and flow on color Doppler imaging.  Venous Reflux:  None.  Other Findings:  None.  IMPRESSION: No evidence of deep venous thrombosis.   Electronically Signed   By: Malachy Moan M.D.   On: 01/15/2015 12:30    Scheduled Meds: . Chlorhexidine Gluconate Cloth  6 each Topical Q0600  . heparin  5,000 Units Subcutaneous 3 times per day  . mupirocin ointment  1 application Nasal BID  . piperacillin-tazobactam (ZOSYN)  IV  3.375 g Intravenous Q8H  . risperiDONE  0.5 mg Oral QHS  . vancomycin  1,000 mg Intravenous Q24H   Continuous Infusions: . sodium chloride 100 mL/hr at 01/16/15 3383    Active Problems:   Hypotension   Sepsis   Cellulitis of left leg   Dementia   Encephalopathy   Acute renal failure   CKD (chronic kidney disease) stage 3, GFR 30-59 ml/min   Rash and nonspecific skin eruption    Time spent:    Allegra Cerniglia  [facility] Pager [phone]. If 7PM-7AM, please contact night-coverage at [email or web address], password [facility] 01/16/2015, 4:38 PM  LOS: 2 days

## 2015-01-17 ENCOUNTER — Encounter (HOSPITAL_COMMUNITY): Payer: Self-pay | Admitting: Internal Medicine

## 2015-01-17 ENCOUNTER — Inpatient Hospital Stay (HOSPITAL_COMMUNITY): Payer: Medicare Other

## 2015-01-17 DIAGNOSIS — E876 Hypokalemia: Secondary | ICD-10-CM | POA: Diagnosis not present

## 2015-01-17 DIAGNOSIS — M545 Low back pain: Secondary | ICD-10-CM | POA: Diagnosis not present

## 2015-01-17 DIAGNOSIS — R1310 Dysphagia, unspecified: Secondary | ICD-10-CM | POA: Diagnosis not present

## 2015-01-17 DIAGNOSIS — N179 Acute kidney failure, unspecified: Secondary | ICD-10-CM | POA: Diagnosis not present

## 2015-01-17 DIAGNOSIS — M7989 Other specified soft tissue disorders: Secondary | ICD-10-CM | POA: Diagnosis not present

## 2015-01-17 DIAGNOSIS — F0390 Unspecified dementia without behavioral disturbance: Secondary | ICD-10-CM | POA: Diagnosis not present

## 2015-01-17 DIAGNOSIS — I959 Hypotension, unspecified: Secondary | ICD-10-CM | POA: Diagnosis not present

## 2015-01-17 DIAGNOSIS — N189 Chronic kidney disease, unspecified: Secondary | ICD-10-CM | POA: Diagnosis not present

## 2015-01-17 DIAGNOSIS — F329 Major depressive disorder, single episode, unspecified: Secondary | ICD-10-CM | POA: Diagnosis not present

## 2015-01-17 DIAGNOSIS — I5030 Unspecified diastolic (congestive) heart failure: Secondary | ICD-10-CM | POA: Diagnosis not present

## 2015-01-17 DIAGNOSIS — N183 Chronic kidney disease, stage 3 (moderate): Secondary | ICD-10-CM | POA: Diagnosis not present

## 2015-01-17 DIAGNOSIS — F29 Unspecified psychosis not due to a substance or known physiological condition: Secondary | ICD-10-CM | POA: Diagnosis not present

## 2015-01-17 DIAGNOSIS — M79675 Pain in left toe(s): Secondary | ICD-10-CM | POA: Diagnosis not present

## 2015-01-17 DIAGNOSIS — I5189 Other ill-defined heart diseases: Secondary | ICD-10-CM

## 2015-01-17 DIAGNOSIS — G9340 Encephalopathy, unspecified: Secondary | ICD-10-CM | POA: Diagnosis not present

## 2015-01-17 DIAGNOSIS — R609 Edema, unspecified: Secondary | ICD-10-CM | POA: Diagnosis not present

## 2015-01-17 DIAGNOSIS — B351 Tinea unguium: Secondary | ICD-10-CM | POA: Diagnosis not present

## 2015-01-17 DIAGNOSIS — Z87898 Personal history of other specified conditions: Secondary | ICD-10-CM | POA: Diagnosis not present

## 2015-01-17 DIAGNOSIS — M79674 Pain in right toe(s): Secondary | ICD-10-CM | POA: Diagnosis not present

## 2015-01-17 DIAGNOSIS — R41841 Cognitive communication deficit: Secondary | ICD-10-CM | POA: Diagnosis not present

## 2015-01-17 DIAGNOSIS — R296 Repeated falls: Secondary | ICD-10-CM | POA: Diagnosis not present

## 2015-01-17 DIAGNOSIS — R011 Cardiac murmur, unspecified: Secondary | ICD-10-CM | POA: Diagnosis not present

## 2015-01-17 DIAGNOSIS — F419 Anxiety disorder, unspecified: Secondary | ICD-10-CM | POA: Diagnosis not present

## 2015-01-17 DIAGNOSIS — R1311 Dysphagia, oral phase: Secondary | ICD-10-CM | POA: Diagnosis not present

## 2015-01-17 DIAGNOSIS — J810 Acute pulmonary edema: Secondary | ICD-10-CM | POA: Diagnosis not present

## 2015-01-17 DIAGNOSIS — G20 Parkinson's disease: Secondary | ICD-10-CM | POA: Diagnosis not present

## 2015-01-17 DIAGNOSIS — Z9181 History of falling: Secondary | ICD-10-CM | POA: Diagnosis not present

## 2015-01-17 DIAGNOSIS — E669 Obesity, unspecified: Secondary | ICD-10-CM | POA: Diagnosis not present

## 2015-01-17 DIAGNOSIS — L03115 Cellulitis of right lower limb: Secondary | ICD-10-CM | POA: Diagnosis not present

## 2015-01-17 DIAGNOSIS — D649 Anemia, unspecified: Secondary | ICD-10-CM | POA: Diagnosis not present

## 2015-01-17 DIAGNOSIS — M6250 Muscle wasting and atrophy, not elsewhere classified, unspecified site: Secondary | ICD-10-CM | POA: Diagnosis not present

## 2015-01-17 DIAGNOSIS — R269 Unspecified abnormalities of gait and mobility: Secondary | ICD-10-CM | POA: Diagnosis not present

## 2015-01-17 DIAGNOSIS — D696 Thrombocytopenia, unspecified: Secondary | ICD-10-CM | POA: Diagnosis not present

## 2015-01-17 DIAGNOSIS — A4181 Sepsis due to Enterococcus: Secondary | ICD-10-CM | POA: Diagnosis not present

## 2015-01-17 DIAGNOSIS — F0391 Unspecified dementia with behavioral disturbance: Secondary | ICD-10-CM | POA: Diagnosis not present

## 2015-01-17 DIAGNOSIS — R5381 Other malaise: Secondary | ICD-10-CM | POA: Diagnosis not present

## 2015-01-17 DIAGNOSIS — N390 Urinary tract infection, site not specified: Secondary | ICD-10-CM | POA: Diagnosis not present

## 2015-01-17 DIAGNOSIS — L03116 Cellulitis of left lower limb: Secondary | ICD-10-CM | POA: Diagnosis not present

## 2015-01-17 DIAGNOSIS — M546 Pain in thoracic spine: Secondary | ICD-10-CM | POA: Diagnosis not present

## 2015-01-17 DIAGNOSIS — M6281 Muscle weakness (generalized): Secondary | ICD-10-CM | POA: Diagnosis not present

## 2015-01-17 DIAGNOSIS — A419 Sepsis, unspecified organism: Secondary | ICD-10-CM | POA: Diagnosis not present

## 2015-01-17 DIAGNOSIS — Z79899 Other long term (current) drug therapy: Secondary | ICD-10-CM | POA: Diagnosis not present

## 2015-01-17 DIAGNOSIS — I501 Left ventricular failure: Secondary | ICD-10-CM | POA: Diagnosis not present

## 2015-01-17 DIAGNOSIS — I10 Essential (primary) hypertension: Secondary | ICD-10-CM | POA: Diagnosis not present

## 2015-01-17 HISTORY — DX: Other ill-defined heart diseases: I51.89

## 2015-01-17 LAB — CBC
HCT: 32.5 % — ABNORMAL LOW (ref 36.0–46.0)
HEMOGLOBIN: 10.7 g/dL — AB (ref 12.0–15.0)
MCH: 29.7 pg (ref 26.0–34.0)
MCHC: 32.9 g/dL (ref 30.0–36.0)
MCV: 90.3 fL (ref 78.0–100.0)
Platelets: 165 10*3/uL (ref 150–400)
RBC: 3.6 MIL/uL — ABNORMAL LOW (ref 3.87–5.11)
RDW: 13.8 % (ref 11.5–15.5)
WBC: 4.8 10*3/uL (ref 4.0–10.5)

## 2015-01-17 LAB — BASIC METABOLIC PANEL
Anion gap: 6 (ref 5–15)
BUN: 22 mg/dL (ref 6–23)
CO2: 24 mmol/L (ref 19–32)
Calcium: 8.3 mg/dL — ABNORMAL LOW (ref 8.4–10.5)
Chloride: 114 mmol/L — ABNORMAL HIGH (ref 96–112)
Creatinine, Ser: 1.25 mg/dL — ABNORMAL HIGH (ref 0.50–1.10)
GFR calc Af Amer: 45 mL/min — ABNORMAL LOW (ref >90)
GFR calc non Af Amer: 39 mL/min — ABNORMAL LOW (ref >90)
Glucose, Bld: 95 mg/dL (ref 70–99)
POTASSIUM: 3.2 mmol/L — AB (ref 3.5–5.1)
Sodium: 144 mmol/L (ref 135–145)

## 2015-01-17 MED ORDER — FUROSEMIDE 20 MG PO TABS: 20.0000 mg | ORAL_TABLET | Freq: Once | ORAL | Status: DC

## 2015-01-17 MED ORDER — POTASSIUM CHLORIDE CRYS ER 20 MEQ PO TBCR: 30.0000 meq | EXTENDED_RELEASE_TABLET | Freq: Two times a day (BID) | ORAL | Status: DC

## 2015-01-17 MED ORDER — LISINOPRIL-HYDROCHLOROTHIAZIDE 20-25 MG PO TABS: 0.5000 | ORAL_TABLET | Freq: Every day | ORAL | Status: DC

## 2015-01-17 MED ORDER — RISPERIDONE 0.5 MG PO TABS: 0.5000 mg | ORAL_TABLET | Freq: Every day | ORAL | Status: DC

## 2015-01-17 MED ORDER — POTASSIUM CHLORIDE CRYS ER 10 MEQ PO TBCR: 10.0000 meq | EXTENDED_RELEASE_TABLET | Freq: Every day | ORAL | Status: AC

## 2015-01-17 NOTE — Clinical Social Work Note (Signed)
CSW facilitated discharge.  CSW notified patient's daughter that patient was being discharged. Ms. Sherry Fox indicated that she would transport patient to facility.  CSW notified Tresa Endo at [facility] of patient's discharged being scheduled for today.  CSW signing off.   Tretha Sciara, [place] [phone]

## 2015-01-17 NOTE — Progress Notes (Signed)
1300 D/C instructions and paperwork packet given to patient and patient's daughter. SN[facility] notified and report given on patient. IV catheter removed from RIGHT forearm, catheter tip intact, no s/s of infection noted, patient tolerated well. Cardiac monitor removed from patient and central tele notified and made aware of patient's d/c status. Patient assisted to vehicle by staff via w/c. Daughter to transport patient to SNF [facility] today.

## 2015-01-17 NOTE — Discharge Summary (Signed)
Physician Discharge Summary  Sherry Fox ZOX:[id number] DOB: Jul 13, 1930 DOA: 01/14/2015  PCP: Mechele Claude, MD  Admit date: 01/14/2015 Discharge date: 01/17/2015  Time spent: Greater than 30 minutes  Recommendations for Outpatient Follow-up:  1. Recommend rechecking the patient's serum potassium and renal function in 1 week. Lisinopril/HCTZ will be resumed in 3 days at a lower dose. 2. Recommend outpatient neurology evaluation of tremor. 3. Patient is being discharged to [facility] skilled nursing facility.  Discharge Diagnoses:  1. Possible sepsis secondary to left lower leg cellulitis. 2. Cellulitis of the left lower leg. 3. Acute kidney injury superimposed on stage III chronic kidney disease. --- Patient's baseline creatinine is 1.2-1.3 --- Patient's creatinine was 2.25 on admission and 1.25 at the time of discharge. 4. Hypotension secondary to hypovolemia and sepsis. 5. Acute encephalopathy with dementia and behavioral disturbances. 6. Pulmonary edema, possibly secondary to mild diastolic dysfunction. --- 2-D echocardiogram revealed ejection fraction of 60-65% and grade 1 diastolic dysfunction. 6. Possible Parkinson's disease. 7. Thrombocytopenia, likely secondary to infection. Resolved. 8. Normocytic anemia. Hemoglobin 10.7 at the time of discharge. Will defer further evaluation if applicable to her PCP. 9. Obesity. 10. Mild hypokalemia. 11. Deconditioning.  Discharge Condition: Improved.  Diet recommendation: Heart healthy.  Filed Weights   01/15/15 0500 01/16/15 0500 01/17/15 0459  Weight: 91.7 kg (202 lb 2.6 oz) 92 kg (202 lb 13.2 oz) 91.8 kg (202 lb 6.1 oz)    History of present illness:  The patient is an 81 year old woman with a past medical history of hypertension, chronic anxiety, and arthritis, who presented from an assisted living facility with confusion. The daughter stated that the patient had been recently treated with a course of Levaquin for a urinary tract  infection 2 weeks prior to her admission. She was also started on treatment with Macrobid when her symptoms persisted, but she developed a rash shortly thereafter and it was discontinued. In the ED, her rash had completely cleared. Her vital signs were noted for hypotension with a systolic blood pressure in the 70s. She was oxygenating 96%. She was not tachycardic. She was not febrile. Her white blood cell count was modestly elevated at 13.4. Her platelet count was 135. Her troponin I was negative. Her creatinine was 2.25. Her urinalysis revealed no evidence of infection. She was admitted for further evaluation and management.  Hospital Course:   Sepsis, likely secondary to left lower extremity cellulitis. The patient was started on vigorous IV fluids, vancomycin, and Zosyn. Blood culture and a urine culture were ordered. They were both negative to date. Her procalcitonin level and lactic ounces at levels were normal. Symptomatically treat and was given. Her leg was elevated to decrease edema and erythema. Lower extremity venous Doppler ultrasound was negative for DVT. Over the course of the hospitalization, the left lower extremity cellulitis completely resolved. She received 3-1/2 days of IV antibiotics. Antibiotics were discontinued at the time of discharge since there was complete resolution of the erythema, she was afebrile, and her white blood cell count was within normal limits.  Acute renal failure on CKD stage 3. The patient's baseline creatinine ranges from 1.2-1.3. It was 2.25 on admission. The acute renal failure/injury was likely secondary to prerenal azotemia in the setting of dehydration and ACE inhibitor/diuretic therapy. Lisinopril/HCTZ was withheld. She was started on vigorous IV fluids. Her renal function improved. It was 1.25 at the time of discharge, likely her baseline.   Encephalopathy-dementia with behavioral disturbances. The patient was confused and intermittently agitated  intermittently  during the hospitalization. This was likely secondary to underlying dementia with more confusion in the hospital setting. She was given IV Haldol was given intermittently. Her mental status improved and was likely back to baseline, according to her daughter at the time of discharge. She was resumed on Risperdal at the time of discharge.  Possible mild diastolic congestive heart failure. The patient's chest x-ray revealed pulmonary edema according to radiology. However, the patient had no complaints of shortness of breath and was not hypoxic. She was started on vigorous IV fluids, but the follow-up chest x-ray revealed partial clearing of pulmonary edema. 2-D echocardiogram was ordered and revealed an ejection fraction of 60-65% and grade 1 diastolic dysfunction. She was given 1 oral dose of Lasix.  Hypertension. The patient was hypotensive on admission which was likely a combination of hypovolemia, blood pressure medications, and sepsis. Lisinopril/HCTZ was held. She was started on vigorous IV fluids. Her blood pressure improved, but was still on the lower end of normal at the time of discharge. Would recommend restarting lisinopril/HCTZ at a lower dose in 3 days as noted on the medication discharge list.  Possible Parkinsons disease.  Patient appear to have had an intermittent "pill rolling" tremor in right hand, shuffling gait and masked facies. Her daughter also has parkinsons. She would likely benefit from an outpatient neurology evaluation. This will be deferred to the PCP at the skilled nursing facility or her regular primary care physician.  Hypokalemia. Her serum potassium was 3.5 on admission, but fell to a nadir of 2.9 following IV fluids. She was repleted orally. Her magnesium level was within normal limits at 2.0. Her serum potassium fell again to 3.2. She was given 30 mEq of potassium chloride at the time of discharge and discharged on 10 mEq daily. Recommend follow-up  evaluation of her renal function and serum potassium.   Procedures:  2-D echocardiogram:Study Conclusions  - Left ventricle: The cavity size was normal. Wall thickness was increased in a pattern of mild LVH. Systolic function was normal. The estimated ejection fraction was in the range of 60% to 65%. Wall motion was normal; there were no regional wall motion abnormalities. Doppler parameters are consistent with abnormal left ventricular relaxation (grade 1 diastolic dysfunction). - Aortic valve: Mildly calcified annulus. Trileaflet; mildly thickened leaflets. Valve area (VTI): 2.39 cm^2. Valve area (Vmax): 2.32 cm^2. - Mitral valve: Mildly calcified annulus. Mildly thickened leaflets - Left atrium: The atrium was mildly dilated. - Technically adequate study.  Consultations:  None  Discharge Exam: Filed Vitals:   01/17/15 0633  BP: 110/62  Pulse: 66  Temp: 98.4 F (36.9 C)  Resp: 18    General: Obese 81 year old Caucasian woman sitting in the chair, asleep, but arousable Cardiovascular: S1, S2, with a soft systolic murmur. Respiratory: Lungs clear to auscultation bilaterally. Breathing nonlabored. Oxygen saturation 95% on room air. Extremities/skin: Complete resolution of left lower extremity erythema. No edema in either leg.  Discharge Instructions   Discharge Instructions    Diet - low sodium heart healthy    Complete by:  As directed      Increase activity slowly    Complete by:  As directed           Current Discharge Medication List    START taking these medications   Details  potassium chloride (K-DUR,KLOR-CON) 10 MEQ tablet Take 1 tablet (10 mEq total) by mouth daily.      CONTINUE these medications which have CHANGED   Details  lisinopril-hydrochlorothiazide (PRINZIDE,ZESTORETIC) 20-25  MG per tablet Take 0.5 tablets by mouth daily. RESTART IN 3 DAYS    risperiDONE (RISPERDAL) 0.5 MG tablet Take 1 tablet (0.5 mg total) by mouth at  bedtime. Qty: 30 tablet, Refills: 0   Associated Diagnoses: Hallucinations      CONTINUE these medications which have NOT CHANGED   Details  cetirizine (ZYRTEC) 10 MG tablet Take 1 tablet (10 mg total) by mouth daily. Qty: 90 tablet, Refills: 0    citalopram (CELEXA) 20 MG tablet Take 20 mg by mouth daily. anxiety      STOP taking these medications     cyanocobalamin (,VITAMIN B-12,) 1000 MCG/ML injection      mirabegron ER (MYRBETRIQ) 25 MG TB24 tablet        Allergies  Allergen Reactions  . Macrobid [Nitrofurantoin Monohyd Macro] Rash  . Meloxicam Rash and Other (See Comments)    Reaction is unknown      The results of significant diagnostics from this hospitalization (including imaging, microbiology, ancillary and laboratory) are listed below for reference.    Significant Diagnostic Studies: Dg Lumbar Spine 2-3 Views  01/10/2015   CLINICAL DATA:  Low back pain for 6 months.  EXAM: LUMBAR SPINE - 2-3 VIEW  COMPARISON:  03/02/2014 .  FINDINGS: Paraspinal soft tissues are normal. Diffuse severe degenerative changes lumbar spine and both hips. 5 mm anterolisthesis L4 on L5. Scratched Scoliosis lumbar spine concave right. No acute bony abnormality.  IMPRESSION: 1. 5 mm anterolisthesis L4 on L5. 2. Diffuse degenerative change and osteopenia . Lumbar scoliosis concave right.   Electronically Signed   By: Maisie Fus  Register   On: 01/10/2015 15:20   Ct Head Wo Contrast  01/14/2015   CLINICAL DATA:  Altered mental status, fall  EXAM: CT HEAD WITHOUT CONTRAST  CT CERVICAL SPINE WITHOUT CONTRAST  TECHNIQUE: Multidetector CT imaging of the head and cervical spine was performed following the standard protocol without intravenous contrast. Multiplanar CT image reconstructions of the cervical spine were also generated.  COMPARISON:  07/27/2014  FINDINGS: CT HEAD FINDINGS  No skull fracture is noted. No intracranial hemorrhage, mass effect or midline shift. No acute cortical infarction. Stable  atrophy. Stable periventricular and patchy subcortical chronic white matter disease. No definite acute cortical infarction. No mass lesion is noted on this unenhanced scan. The gray and white-matter differentiation is preserved.  CT CERVICAL SPINE FINDINGS  Axial images of the cervical spine shows no acute fracture or subluxation. Computer processed images shows no acute fracture or subluxation. Mild degenerative changes C1-C2 articulation. There is disc space flattening with mild anterior spurring at C4-C5, C5-C6 and C6-C7 level. Mild posterior spurring noted at C5-C6 and C6-C7 level. No prevertebral soft tissue swelling. Cervical airway is patent.  There is no pneumothorax in visualized lung apices.  IMPRESSION: 1. No acute intracranial abnormality. Stable atrophy and chronic white matter disease. 2. No cervical spine acute fracture or subluxation. Mild degenerative changes as described above.   Electronically Signed   By: Natasha Mead M.D.   On: 01/14/2015 15:05   Ct Cervical Spine Wo Contrast  01/14/2015   CLINICAL DATA:  Altered mental status, fall  EXAM: CT HEAD WITHOUT CONTRAST  CT CERVICAL SPINE WITHOUT CONTRAST  TECHNIQUE: Multidetector CT imaging of the head and cervical spine was performed following the standard protocol without intravenous contrast. Multiplanar CT image reconstructions of the cervical spine were also generated.  COMPARISON:  07/27/2014  FINDINGS: CT HEAD FINDINGS  No skull fracture is noted. No intracranial hemorrhage, mass  effect or midline shift. No acute cortical infarction. Stable atrophy. Stable periventricular and patchy subcortical chronic white matter disease. No definite acute cortical infarction. No mass lesion is noted on this unenhanced scan. The gray and white-matter differentiation is preserved.  CT CERVICAL SPINE FINDINGS  Axial images of the cervical spine shows no acute fracture or subluxation. Computer processed images shows no acute fracture or subluxation. Mild  degenerative changes C1-C2 articulation. There is disc space flattening with mild anterior spurring at C4-C5, C5-C6 and C6-C7 level. Mild posterior spurring noted at C5-C6 and C6-C7 level. No prevertebral soft tissue swelling. Cervical airway is patent.  There is no pneumothorax in visualized lung apices.  IMPRESSION: 1. No acute intracranial abnormality. Stable atrophy and chronic white matter disease. 2. No cervical spine acute fracture or subluxation. Mild degenerative changes as described above.   Electronically Signed   By: Natasha Mead M.D.   On: 01/14/2015 15:05   US Venous Img Lower Unilateral Left  01/15/2015   CLINICAL DATA:  81 year old female with left lower extremity erythema, swelling and cellulitis  EXAM: LEFT LOWER EXTREMITY VENOUS DOPPLER ULTRASOUND  TECHNIQUE: Gray-scale sonography with graded compression, as well as color Doppler and duplex ultrasound were performed to evaluate the lower extremity deep venous systems from the level of the common femoral vein and including the common femoral, femoral, profunda femoral, popliteal and calf veins including the posterior tibial, peroneal and gastrocnemius veins when visible. The superficial great saphenous vein was also interrogated. Spectral Doppler was utilized to evaluate flow at rest and with distal augmentation maneuvers in the common femoral, femoral and popliteal veins.  COMPARISON:  None.  FINDINGS: Contralateral Common Femoral Vein: Respiratory phasicity is normal and symmetric with the symptomatic side. No evidence of thrombus. Normal compressibility.  Common Femoral Vein: No evidence of thrombus. Normal compressibility, respiratory phasicity and response to augmentation.  Saphenofemoral Junction: No evidence of thrombus. Normal compressibility and flow on color Doppler imaging.  Profunda Femoral Vein: No evidence of thrombus. Normal compressibility and flow on color Doppler imaging.  Femoral Vein: No evidence of thrombus. Normal  compressibility, respiratory phasicity and response to augmentation.  Popliteal Vein: No evidence of thrombus. Normal compressibility, respiratory phasicity and response to augmentation.  Calf Veins: No evidence of thrombus. Normal compressibility and flow on color Doppler imaging.  Superficial Great Saphenous Vein: No evidence of thrombus. Normal compressibility and flow on color Doppler imaging.  Venous Reflux:  None.  Other Findings:  None.  IMPRESSION: No evidence of deep venous thrombosis.   Electronically Signed   By: Malachy Moan M.D.   On: 01/15/2015 12:30   Dg Chest Port 1 View  01/17/2015   CLINICAL DATA:  Acute renal failure.  EXAM: PORTABLE CHEST - 1 VIEW  COMPARISON:  01/14/2015.  FINDINGS: Mediastinum and hilar structures normal. Cardiomegaly with interim improvement pulmonary venous congestion and interstitial prominence from 01/14/2015. These findings are consistent with improving congestive heart failure. No pleural effusion or pneumothorax.  IMPRESSION: Interim partial clearing of congestive heart failure and pulmonary interstitial edema from 01/14/2015.   Electronically Signed   By: Maisie Fus  Register   On: 01/17/2015 09:29   Dg Chest Port 1 View  01/14/2015   CLINICAL DATA:  Altered mental status, currently being treated for urinary tract infection  EXAM: PORTABLE CHEST - 1 VIEW  COMPARISON:  03/02/2014  FINDINGS: Heart size is mildly enlarged. Center vascular congestion noted. Diffusely prominent interstitial markings are identified. Lungs are hypoaerated with crowding of the bronchovascular markings.  IMPRESSION: Mild cardiomegaly with diffusely prominent interstitial markings which could indicate developing interstitial edema. This would be better assessed at PA and lateral chest radiographs performed at full inspiration when the patient is clinically able.   Electronically Signed   By: Christiana Pellant M.D.   On: 01/14/2015 14:08    Microbiology: Recent Results (from the past 240  hour(s))  Urine culture     Status: Abnormal   Collection Time: 01/10/15  3:00 PM  Result Value Ref Range Status   Urine Culture, Routine Final report (A)  Final   Result 1 Comment (A)  Final    Comment: Escherichia coli, identified by an automated biochemical system. Greater than 100,000 colony forming units per mL    ANTIMICROBIAL SUSCEPTIBILITY Comment  Final    Comment:       ** S = Susceptible; I = Intermediate; R = Resistant **                    P = Positive; N = Negative             MICS are expressed in micrograms per mL    Antibiotic                 RSLT#1    RSLT#2    RSLT#3    RSLT#4 Amoxicillin/Clavulanic Acid    S Ampicillin                     R Cefepime                       S Ceftriaxone                    S Cefuroxime                     I Cephalothin                    I Ciprofloxacin                  R Ertapenem                      S Gentamicin                     R Imipenem                       S Levofloxacin                   R Nitrofurantoin                 S Piperacillin                   R Tetracycline                   R Tobramycin                     I Trimethoprim/Sulfa             R   Blood Culture (routine x 2)     Status: None (Preliminary result)   Collection Time: 01/14/15  1:13 PM  Result Value Ref Range Status   Specimen Description LEFT ANTECUBITAL  Final   Special Requests BOTTLES DRAWN AEROBIC AND ANAEROBIC [facility]  Final   Culture  NO GROWTH 1 DAY  Final   Report Status PENDING  Incomplete  Blood Culture (routine x 2)     Status: None (Preliminary result)   Collection Time: 01/14/15  1:22 PM  Result Value Ref Range Status   Specimen Description BLOOD RIGHT FOREARM  Final   Special Requests BOTTLES DRAWN AEROBIC AND ANAEROBIC 6CC  Final   Culture NO GROWTH 1 DAY  Final   Report Status PENDING  Incomplete  Urine culture     Status: None   Collection Time: 01/14/15  3:35 PM  Result Value Ref Range Status   Specimen Description URINE,  CATHETERIZED  Final   Special Requests NONE  Final   Colony Count NO GROWTH Performed at [facility]   Final   Culture NO GROWTH Performed at [facility]   Final   Report Status 01/15/2015 FINAL  Final  MRSA PCR Screening     Status: Abnormal   Collection Time: 01/14/15  7:30 PM  Result Value Ref Range Status   MRSA by PCR POSITIVE (A) NEGATIVE Final    Comment:        The GeneXpert MRSA Assay (FDA approved for NASAL specimens only), is one component of a comprehensive MRSA colonization surveillance program. It is not intended to diagnose MRSA infection nor to guide or monitor treatment for MRSA infections. RESULT CALLED TO, READ BACK BY AND VERIFIED WITH: NELSON,T AT 2110 ON 01/14/15 BY MOSLEY,J      Labs: Basic Metabolic Panel:  Recent Labs Lab 01/14/15 1313 01/14/15 2026 01/15/15 0457 01/15/15 0600 01/15/15 1503 01/17/15 0708  NA 137  --  139  --  140 144  K 3.5  --  2.9*  --  3.5 3.2*  CL 103  --  108  --  109 114*  CO2 27  --  23  --  25 24  GLUCOSE 136*  --  101*  --  153* 95  BUN 66*  --  50*  --  44* 22  CREATININE 2.25* 1.96* 1.66*  --  1.74* 1.25*  CALCIUM 7.8*  --  7.5*  --  7.6* 8.3*  MG  --   --   --  2.0  --   --    Liver Function Tests:  Recent Labs Lab 01/14/15 1313 01/15/15 0457  AST 25 22  ALT 13 13  ALKPHOS 51 44  BILITOT 0.7 0.7  PROT 5.7* 5.4*  ALBUMIN 2.9* 2.6*   No results for input(s): LIPASE, AMYLASE in the last 168 hours. No results for input(s): AMMONIA in the last 168 hours. CBC:  Recent Labs Lab 01/14/15 1313 01/14/15 2026 01/15/15 0457 01/17/15 0708  WBC 13.4* 8.7 6.5 4.8  NEUTROABS 12.2*  --   --   --   HGB 12.3 11.4* 10.8* 10.7*  HCT 37.1 34.9* 33.3* 32.5*  MCV 91.2 90.6 90.2 90.3  PLT 135* 132* 128* 165   Cardiac Enzymes:  Recent Labs Lab 01/14/15 1313 01/14/15 2027 01/15/15 0027 01/15/15 0600  TROPONINI <0.03 <0.03 <0.03 <0.03   BNP: BNP (last 3 results) No results for  input(s): BNP in the last 8760 hours.  ProBNP (last 3 results)  Recent Labs  03/02/14 1835  PROBNP 302.2    CBG: No results for input(s): GLUCAP in the last 168 hours.     Signed:  Scheryl Sanborn  [facility] 01/17/2015, 11:29 AM

## 2015-01-20 DIAGNOSIS — F329 Major depressive disorder, single episode, unspecified: Secondary | ICD-10-CM | POA: Diagnosis not present

## 2015-01-20 DIAGNOSIS — N189 Chronic kidney disease, unspecified: Secondary | ICD-10-CM | POA: Diagnosis not present

## 2015-01-20 DIAGNOSIS — F0390 Unspecified dementia without behavioral disturbance: Secondary | ICD-10-CM | POA: Diagnosis not present

## 2015-01-20 DIAGNOSIS — I10 Essential (primary) hypertension: Secondary | ICD-10-CM | POA: Diagnosis not present

## 2015-01-20 LAB — CULTURE, BLOOD (ROUTINE X 2)
CULTURE: NO GROWTH
Culture: NO GROWTH

## 2015-01-29 ENCOUNTER — Ambulatory Visit: Payer: Medicare Other | Admitting: Family Medicine

## 2015-02-01 DIAGNOSIS — A4181 Sepsis due to Enterococcus: Secondary | ICD-10-CM | POA: Diagnosis not present

## 2015-02-01 DIAGNOSIS — M6250 Muscle wasting and atrophy, not elsewhere classified, unspecified site: Secondary | ICD-10-CM | POA: Diagnosis not present

## 2015-02-01 DIAGNOSIS — I10 Essential (primary) hypertension: Secondary | ICD-10-CM | POA: Diagnosis not present

## 2015-02-01 DIAGNOSIS — L03115 Cellulitis of right lower limb: Secondary | ICD-10-CM | POA: Diagnosis not present

## 2015-02-01 DIAGNOSIS — N183 Chronic kidney disease, stage 3 (moderate): Secondary | ICD-10-CM | POA: Diagnosis not present

## 2015-02-13 ENCOUNTER — Ambulatory Visit (INDEPENDENT_AMBULATORY_CARE_PROVIDER_SITE_OTHER): Payer: Medicare Other | Admitting: Neurology

## 2015-02-13 ENCOUNTER — Other Ambulatory Visit: Payer: Self-pay | Admitting: Family Medicine

## 2015-02-13 ENCOUNTER — Encounter: Payer: Self-pay | Admitting: Neurology

## 2015-02-13 VITALS — BP 145/82 | HR 76 | Resp 16 | Ht 62.5 in | Wt 202.0 lb

## 2015-02-13 DIAGNOSIS — R296 Repeated falls: Secondary | ICD-10-CM | POA: Diagnosis not present

## 2015-02-13 DIAGNOSIS — Z9289 Personal history of other medical treatment: Secondary | ICD-10-CM

## 2015-02-13 DIAGNOSIS — Z9181 History of falling: Secondary | ICD-10-CM

## 2015-02-13 DIAGNOSIS — G20 Parkinson's disease: Secondary | ICD-10-CM | POA: Diagnosis not present

## 2015-02-13 DIAGNOSIS — M7989 Other specified soft tissue disorders: Secondary | ICD-10-CM

## 2015-02-13 DIAGNOSIS — Z87898 Personal history of other specified conditions: Secondary | ICD-10-CM

## 2015-02-13 MED ORDER — CARBIDOPA-LEVODOPA 25-100 MG PO TABS: ORAL_TABLET | ORAL | Status: DC

## 2015-02-13 MED ORDER — CARBIDOPA-LEVODOPA 25-100 MG PO TABS: ORAL_TABLET | ORAL | Status: AC

## 2015-02-13 NOTE — Progress Notes (Signed)
Subjective:    Patient ID: Sherry Fox is a 81 y.o. female.  HPI     Huston Foley, MD, PhD [facility] [address] [address] [place], [place] [address]  Dear Dr. Virgina Organ,   I saw your patient, Sherry Fox, upon your kind request in my neurologic clinic today for initial consultation of her tremors. The patient is accompanied by her daugher, Sherry Fox, today. As you know, Sherry Fox is a 81 year old right-handed woman with an underlying medical history of hypertension, depression, allergies, arthritis, and obesity, as well as recent hospitalization from 01/14/2015 through 01/17/2015 for cellulitis, altered mental status, low BP, dehydration and concern for sepsis secondary to cellulitis, complicated by acute kidney injury superimposed on chronic kidney disease, who has been noted to have right-sided parkinsonian signs. I reviewed the hospital records. I reviewed her discharge summary. She lives in assisted living and was discharged to [facility] skilled nursing facility. She has fallen. She had a head CT without contrast as well as a cervical spine CT without contrast after she had fallen. I reviewed the test results: No acute intracranial abnormality. Stable atrophy and chronic white matter disease. No cervical spine acute fracture or subluxation. Mild degenerative changes. In addition, I personally reviewed the images through the PACS system.  Her history is primarily provided by Sherry Fox. Her daughter recalls that about 2 years ago the patient started having problems with her walking. At the time, she was residing in [facility] in [place], [place] for about 9 months, but was falling and eventually had to be taken to the hospital. About a year ago she started having of right hand tremor. About 8 or 9 months ago she started having hallucinations and memory loss. Since she has been at the skilled nursing facility she has been going through therapy. She's not able  to walk with her walker by herself. She did not bring her walker today and is situated in a nursing home wheelchair and was brought in by wheelchair [place] transportation. Of note, her other daughter, Sherry Fox, age 26, has a history of Parkinson's disease for years. She herself has never been on treatment for Parkinson's disease and was not previously diagnosed. She has been on Risperdal. As I understand, this was started when she was in assisted living and had problems sleeping at night. She is still on it, 0.5 mg each night.  Her Past Medical History Is Significant For: Past Medical History  Diagnosis Date  . Arthritis   . Hypercholesteremia   . Hypertension   . Anxiety   . CKD (chronic kidney disease) stage 3, GFR 30-59 ml/min 01/15/2015  . Cellulitis of left leg 01/14/2015  . Diastolic dysfunction 01/17/2015    EF 60-65%. Grade 1 diastolic dysfunction.  . Dementia   . CHF (congestive heart failure)   . Anemia   . Hypokalemia   . Psychoses   . Major depressive disorder   . Muscle weakness   . Cardiac murmur   . Dysphagia     Her Past Surgical History Is Significant For: Past Surgical History  Procedure Laterality Date  . Kidney cyst removal      Her Family History Is Significant For: Family History  Problem Relation Age of Onset  . Diabetes Father     Her Social History Is Significant For: History   Social History  . Marital Status: Widowed    Spouse Name: N/A  . Number of Children: 2  . Years of Education:  10th grade   Social History Main Topics  . Smoking status: Never Smoker   . Smokeless tobacco: Not on file  . Alcohol Use: No  . Drug Use: No  . Sexual Activity: Not on file   Other Topics Concern  . None   Social History Narrative   1 Mt Dew a day     Her Allergies Are:  Allergies  Allergen Reactions  . Macrobid [Nitrofurantoin Monohyd Macro] Rash  . Meloxicam Rash and Other (See Comments)    Reaction is unknown  :   Her Current Medications Are:   Outpatient Encounter Prescriptions as of 02/13/2015  Medication Sig  . cetirizine (ZYRTEC) 10 MG tablet Take 1 tablet (10 mg total) by mouth daily.  . citalopram (CELEXA) 20 MG tablet Take 20 mg by mouth daily. anxiety  . furosemide (LASIX) 20 MG tablet Take 20 mg by mouth.  Marland Kitchen lisinopril-hydrochlorothiazide (PRINZIDE,ZESTORETIC) 20-25 MG per tablet Take 0.5 tablets by mouth daily. RESTART IN 3 DAYS  . potassium chloride (K-DUR,KLOR-CON) 10 MEQ tablet Take 1 tablet (10 mEq total) by mouth daily.  . risperiDONE (RISPERDAL) 0.5 MG tablet Take 1 tablet (0.5 mg total) by mouth at bedtime.  :   Review of Systems:  Out of a complete 14 point review of systems, all are reviewed and negative with the exception of these symptoms as listed below:   Review of Systems  Constitutional: Positive for chills and fatigue.  Cardiovascular: Positive for leg swelling.  Gastrointestinal: Positive for constipation.  Endocrine:       Feeling cold   Neurological:       Memory loss, confusion, dizziness, Increased falls, unsteady gait  Psychiatric/Behavioral:       Depression Anxiety, Hallucinations    Objective:  Neurologic Exam  Physical Exam Physical Examination:   Filed Vitals:   02/13/15 1358  BP: 145/82  Pulse: 76  Resp: 16    General Examination: The patient is a very pleasant 81 y.o. female in no acute distress.  HEENT: Normocephalic, atraumatic, pupils are equal, round and reactive to light and accommodation. Funduscopic exam is normal with sharp disc margins noted. Extraocular tracking shows moderate saccadic breakdown without nystagmus noted. There is gaze limitation to entire area gaze. There is mild decrease in eye blink rate. Hearing is intact. Face is symmetric with moderate facial masking and normal facial sensation. There is no lip, neck or jaw tremor. Neck is moderately rigid with intact passive ROM. There are no carotid bruits on auscultation. Oropharynx exam reveals mild mouth  dryness. No significant airway crowding is noted. Mallampati is class II. Tongue protrudes centrally and palate elevates symmetrically.   There is no drooling.   Chest: is clear to auscultation without wheezing, rhonchi or crackles noted.  Heart: sounds are regular and normal without murmurs, rubs or gallops noted.   Abdomen: is soft, non-tender and non-distended with normal bowel sounds appreciated on auscultation.  Extremities: There is 2+ pitting edema in the distal lower extremities bilaterally. Pedal pulses are intact. Chronic stasis-like changes are noted in the distal legs bilaterally. There are no varicose veins.  Skin: is warm and dry with no trophic changes noted. Age-related changes are noted on the skin.   Musculoskeletal: exam reveals no obvious joint deformities, tenderness, joint swelling or erythema.  Neurologically:  Mental status: The patient is not fully able to follow just verbal commands. She is able to mimic The patient is awake and alert, paying fair  attention. She is unable to fully  provide the history. Her daughter provides almost the entire history. She is oriented to: person, place, situation and month of year. Her memory, attention, language and knowledge are impaired. There is no aphasia, agnosia, apraxia or anomia. There is a mild degree of bradyphrenia. Speech is moderately hypophonic with no dysarthria noted. Mood is congruent and affect is blunted and constricted.   Cranial nerves are as described above under HEENT exam. In addition, shoulder shrug is normal with equal shoulder height noted.  Motor exam: Normal bulk, and strength for age is noted. There are no dyskinesias noted.   Tone is mildly rigid with presence of cogwheeling in the right upper extremity. There is overall moderate bradykinesia. There is no drift or rebound.  There is an intermittent mild right upper extremity resting tremor. She has no postural or action tremor. She has moderate difficulty  with fine motor skills on the right. She has mild to moderate difficulty on the left. She is unable to stand with assistance and she does not have a walker with her today. Therefore could not walk her. Reflexes are 1+. They are absent in the ankles. Sensory exam seems intact to light touch, pinprick, vibration and temperature sense. She may have reduction in vibration sense in the distal lower extremities but this could be compounded by the swelling.  Cerebellar testing shows no dysmetria or intention tremor on finger to nose testing. Heel to shin is not possible for her.   Assessment and Plan:   In summary, Sherry Fox is a very pleasant 81 y.o.-year old female with an underlying medical history of hypertension, depression, allergies, arthritis, and obesity, as well as recent hospitalization from 01/14/2015 through 01/17/2015 for cellulitis, altered mental status, low BP, dehydration and concern for sepsis secondary to cellulitis, complicated by acute kidney injury superimposed on chronic kidney disease, who has been noted to have right-sided parkinsonian signs. She has a family history of Parkinson's disease in her daughter who is now 72 years old but has had Parkinson's disease for years. The patient's history and physical exam concerning for parkinsonism, in particular possible right-sided predominant Parkinson's disease. She has had significant gait impairment for at least 2 years. She has had associated hallucinations and memory loss. I would recommend tapering off the first part out. I would continue with physical, occupational speech therapy. She is advised not to walk by herself even with the walker by herself until it is deemed safe to do so per physical therapy recommendations. She is advised to start a trial of low-dose Sinemet, 25-100 milligrams strength half a pill twice a day, at 8 AM and noon for about a week and then increase to half pill 3 times a day, namely at 8 AM, noon and 4 PM. I would  like to see her back in about 3 months, sooner if the need arises. I discussed this with the patient and her daughter, Sherry Fox, today. I had a long chat with the patient and her daughter about Her symptoms, my findings and the diagnosis of parkinsonism/Parksinson's disease, its prognosis and treatment options. We talked about medical treatments and non-pharmacological approaches.  She has significant lower extremity edema. She is advised to elevate her legs and try compression stockings. I answered all their questions today and the patient and her daughter were in agreement.  Thank you very much for allowing me to participate in the care of this nice patient. If I can be of any further assistance to you please do not hesitate to  call me at [phone].  Sincerely,   Huston Foley, MD, PhD  I spent 45 minutes in total face-to-face time with the patient, more than 50% of which was spent in counseling and coordination of care, reviewing test results, reviewing medication and discussing or reviewing the diagnosis of PD, its prognosis and treatment options.

## 2015-02-13 NOTE — Patient Instructions (Signed)
Do not walk by yourself! You need to use a walker and have assistance.  You have some signs of parkinsonism. We will try Sinemet (generic name: carbidopa-levodopa) 25/100 mg: Take half a pill twice daily (8 AM and noon) for one week, then half a pill 3 times a day (8 AM, noon, and 4 PM) thereafter. Please try to take the medication away from you mealtimes, that is, ideally either one hour before or 2 hours after your meal to ensure optimal absorption. The medication can interfere with the protein content of your meal and trying to the protein in your food and therefore not get fully absorbed.  Common side effects reported are: Nausea, vomiting, sedation, confusion, lightheadedness. Rare side effects include hallucinations, severe nausea or vomiting, diarrhea and significant drop in blood pressure especially when going from lying to standing or from sitting to standing.  Follow up in 3 months.  I will ask your Doctor to taper you off of the Risperdal.

## 2015-02-22 DIAGNOSIS — I10 Essential (primary) hypertension: Secondary | ICD-10-CM | POA: Diagnosis not present

## 2015-02-22 DIAGNOSIS — L03115 Cellulitis of right lower limb: Secondary | ICD-10-CM | POA: Diagnosis not present

## 2015-02-22 DIAGNOSIS — A4181 Sepsis due to Enterococcus: Secondary | ICD-10-CM | POA: Diagnosis not present

## 2015-02-22 DIAGNOSIS — N183 Chronic kidney disease, stage 3 (moderate): Secondary | ICD-10-CM | POA: Diagnosis not present

## 2015-02-22 DIAGNOSIS — M6250 Muscle wasting and atrophy, not elsewhere classified, unspecified site: Secondary | ICD-10-CM | POA: Diagnosis not present

## 2015-03-27 DIAGNOSIS — I10 Essential (primary) hypertension: Secondary | ICD-10-CM | POA: Diagnosis not present

## 2015-03-27 DIAGNOSIS — M6250 Muscle wasting and atrophy, not elsewhere classified, unspecified site: Secondary | ICD-10-CM | POA: Diagnosis not present

## 2015-03-27 DIAGNOSIS — A4181 Sepsis due to Enterococcus: Secondary | ICD-10-CM | POA: Diagnosis not present

## 2015-03-27 DIAGNOSIS — L03115 Cellulitis of right lower limb: Secondary | ICD-10-CM | POA: Diagnosis not present

## 2015-03-27 DIAGNOSIS — N183 Chronic kidney disease, stage 3 (moderate): Secondary | ICD-10-CM | POA: Diagnosis not present

## 2015-04-03 DIAGNOSIS — M6281 Muscle weakness (generalized): Secondary | ICD-10-CM | POA: Diagnosis not present

## 2015-04-03 DIAGNOSIS — R278 Other lack of coordination: Secondary | ICD-10-CM | POA: Diagnosis not present

## 2015-04-03 DIAGNOSIS — R1311 Dysphagia, oral phase: Secondary | ICD-10-CM | POA: Diagnosis not present

## 2015-04-04 DIAGNOSIS — R278 Other lack of coordination: Secondary | ICD-10-CM | POA: Diagnosis not present

## 2015-04-04 DIAGNOSIS — R1311 Dysphagia, oral phase: Secondary | ICD-10-CM | POA: Diagnosis not present

## 2015-04-04 DIAGNOSIS — M6281 Muscle weakness (generalized): Secondary | ICD-10-CM | POA: Diagnosis not present

## 2015-04-05 DIAGNOSIS — R278 Other lack of coordination: Secondary | ICD-10-CM | POA: Diagnosis not present

## 2015-04-05 DIAGNOSIS — R1311 Dysphagia, oral phase: Secondary | ICD-10-CM | POA: Diagnosis not present

## 2015-04-05 DIAGNOSIS — M6281 Muscle weakness (generalized): Secondary | ICD-10-CM | POA: Diagnosis not present

## 2015-04-06 DIAGNOSIS — R1311 Dysphagia, oral phase: Secondary | ICD-10-CM | POA: Diagnosis not present

## 2015-04-06 DIAGNOSIS — M6281 Muscle weakness (generalized): Secondary | ICD-10-CM | POA: Diagnosis not present

## 2015-04-06 DIAGNOSIS — R278 Other lack of coordination: Secondary | ICD-10-CM | POA: Diagnosis not present

## 2015-04-07 DIAGNOSIS — R1311 Dysphagia, oral phase: Secondary | ICD-10-CM | POA: Diagnosis not present

## 2015-04-07 DIAGNOSIS — R278 Other lack of coordination: Secondary | ICD-10-CM | POA: Diagnosis not present

## 2015-04-07 DIAGNOSIS — M6281 Muscle weakness (generalized): Secondary | ICD-10-CM | POA: Diagnosis not present

## 2015-04-09 DIAGNOSIS — R1311 Dysphagia, oral phase: Secondary | ICD-10-CM | POA: Diagnosis not present

## 2015-04-09 DIAGNOSIS — R278 Other lack of coordination: Secondary | ICD-10-CM | POA: Diagnosis not present

## 2015-04-09 DIAGNOSIS — M6281 Muscle weakness (generalized): Secondary | ICD-10-CM | POA: Diagnosis not present

## 2015-04-10 DIAGNOSIS — R278 Other lack of coordination: Secondary | ICD-10-CM | POA: Diagnosis not present

## 2015-04-10 DIAGNOSIS — R1311 Dysphagia, oral phase: Secondary | ICD-10-CM | POA: Diagnosis not present

## 2015-04-10 DIAGNOSIS — M6281 Muscle weakness (generalized): Secondary | ICD-10-CM | POA: Diagnosis not present

## 2015-04-11 DIAGNOSIS — R278 Other lack of coordination: Secondary | ICD-10-CM | POA: Diagnosis not present

## 2015-04-11 DIAGNOSIS — R1311 Dysphagia, oral phase: Secondary | ICD-10-CM | POA: Diagnosis not present

## 2015-04-11 DIAGNOSIS — M6281 Muscle weakness (generalized): Secondary | ICD-10-CM | POA: Diagnosis not present

## 2015-04-12 DIAGNOSIS — M6281 Muscle weakness (generalized): Secondary | ICD-10-CM | POA: Diagnosis not present

## 2015-04-12 DIAGNOSIS — R278 Other lack of coordination: Secondary | ICD-10-CM | POA: Diagnosis not present

## 2015-04-12 DIAGNOSIS — R1311 Dysphagia, oral phase: Secondary | ICD-10-CM | POA: Diagnosis not present

## 2015-04-13 DIAGNOSIS — M6281 Muscle weakness (generalized): Secondary | ICD-10-CM | POA: Diagnosis not present

## 2015-04-13 DIAGNOSIS — R1311 Dysphagia, oral phase: Secondary | ICD-10-CM | POA: Diagnosis not present

## 2015-04-13 DIAGNOSIS — R278 Other lack of coordination: Secondary | ICD-10-CM | POA: Diagnosis not present

## 2015-04-16 DIAGNOSIS — R1311 Dysphagia, oral phase: Secondary | ICD-10-CM | POA: Diagnosis not present

## 2015-04-16 DIAGNOSIS — M6281 Muscle weakness (generalized): Secondary | ICD-10-CM | POA: Diagnosis not present

## 2015-04-16 DIAGNOSIS — R278 Other lack of coordination: Secondary | ICD-10-CM | POA: Diagnosis not present

## 2015-04-17 DIAGNOSIS — R1311 Dysphagia, oral phase: Secondary | ICD-10-CM | POA: Diagnosis not present

## 2015-04-17 DIAGNOSIS — M6281 Muscle weakness (generalized): Secondary | ICD-10-CM | POA: Diagnosis not present

## 2015-04-17 DIAGNOSIS — R278 Other lack of coordination: Secondary | ICD-10-CM | POA: Diagnosis not present

## 2015-04-17 DIAGNOSIS — Z961 Presence of intraocular lens: Secondary | ICD-10-CM | POA: Diagnosis not present

## 2015-04-18 DIAGNOSIS — M6281 Muscle weakness (generalized): Secondary | ICD-10-CM | POA: Diagnosis not present

## 2015-04-18 DIAGNOSIS — R278 Other lack of coordination: Secondary | ICD-10-CM | POA: Diagnosis not present

## 2015-04-18 DIAGNOSIS — R1311 Dysphagia, oral phase: Secondary | ICD-10-CM | POA: Diagnosis not present

## 2015-04-19 DIAGNOSIS — R278 Other lack of coordination: Secondary | ICD-10-CM | POA: Diagnosis not present

## 2015-04-19 DIAGNOSIS — R1311 Dysphagia, oral phase: Secondary | ICD-10-CM | POA: Diagnosis not present

## 2015-04-19 DIAGNOSIS — M6281 Muscle weakness (generalized): Secondary | ICD-10-CM | POA: Diagnosis not present

## 2015-04-20 DIAGNOSIS — R278 Other lack of coordination: Secondary | ICD-10-CM | POA: Diagnosis not present

## 2015-04-23 DIAGNOSIS — R278 Other lack of coordination: Secondary | ICD-10-CM | POA: Diagnosis not present

## 2015-04-24 DIAGNOSIS — R278 Other lack of coordination: Secondary | ICD-10-CM | POA: Diagnosis not present

## 2015-04-25 DIAGNOSIS — R278 Other lack of coordination: Secondary | ICD-10-CM | POA: Diagnosis not present

## 2015-04-26 DIAGNOSIS — R278 Other lack of coordination: Secondary | ICD-10-CM | POA: Diagnosis not present

## 2015-05-09 DIAGNOSIS — N183 Chronic kidney disease, stage 3 (moderate): Secondary | ICD-10-CM | POA: Diagnosis not present

## 2015-05-09 DIAGNOSIS — I10 Essential (primary) hypertension: Secondary | ICD-10-CM | POA: Diagnosis not present

## 2015-05-09 DIAGNOSIS — L03115 Cellulitis of right lower limb: Secondary | ICD-10-CM | POA: Diagnosis not present

## 2015-05-09 DIAGNOSIS — M6250 Muscle wasting and atrophy, not elsewhere classified, unspecified site: Secondary | ICD-10-CM | POA: Diagnosis not present

## 2015-05-09 DIAGNOSIS — A4181 Sepsis due to Enterococcus: Secondary | ICD-10-CM | POA: Diagnosis not present

## 2015-05-10 DIAGNOSIS — M79651 Pain in right thigh: Secondary | ICD-10-CM | POA: Diagnosis not present

## 2015-05-10 DIAGNOSIS — M25551 Pain in right hip: Secondary | ICD-10-CM | POA: Diagnosis not present

## 2015-05-10 DIAGNOSIS — M79661 Pain in right lower leg: Secondary | ICD-10-CM | POA: Diagnosis not present

## 2015-05-14 DIAGNOSIS — D649 Anemia, unspecified: Secondary | ICD-10-CM | POA: Diagnosis not present

## 2015-05-14 DIAGNOSIS — M6281 Muscle weakness (generalized): Secondary | ICD-10-CM | POA: Diagnosis not present

## 2015-05-15 DIAGNOSIS — M6281 Muscle weakness (generalized): Secondary | ICD-10-CM | POA: Diagnosis not present

## 2015-05-17 ENCOUNTER — Ambulatory Visit (INDEPENDENT_AMBULATORY_CARE_PROVIDER_SITE_OTHER): Payer: Medicare Other | Admitting: Neurology

## 2015-05-17 ENCOUNTER — Encounter: Payer: Self-pay | Admitting: Neurology

## 2015-05-17 VITALS — BP 124/68 | HR 72 | Resp 14 | Ht 62.0 in | Wt 183.0 lb

## 2015-05-17 DIAGNOSIS — R296 Repeated falls: Secondary | ICD-10-CM | POA: Diagnosis not present

## 2015-05-17 DIAGNOSIS — M7989 Other specified soft tissue disorders: Secondary | ICD-10-CM

## 2015-05-17 DIAGNOSIS — Z9181 History of falling: Secondary | ICD-10-CM | POA: Diagnosis not present

## 2015-05-17 DIAGNOSIS — M6281 Muscle weakness (generalized): Secondary | ICD-10-CM | POA: Diagnosis not present

## 2015-05-17 DIAGNOSIS — G20 Parkinson's disease: Secondary | ICD-10-CM

## 2015-05-17 NOTE — Patient Instructions (Signed)
Use your walker at all times - you are at fall risk!  We will continue your medications.   Ask your doctor about heartburn medication.

## 2015-05-17 NOTE — Progress Notes (Signed)
Subjective:    Patient ID: Sherry Fox is a 81 y.o. female.  HPI     Interim history:   Sherry Fox is a 81 year old right-handed woman with an underlying medical history of hypertension, depression, allergies, arthritis, and obesity, as well as recent hospitalization from 01/14/2015 through 01/17/2015 for cellulitis, altered mental status, low BP, dehydration and concern for sepsis secondary to cellulitis, complicated by acute kidney injury superimposed on chronic kidney disease, who presents for follow-up consultation of her parkinsonism. The patient is accompanied by her daughter again today. I first met her on 02/13/2015 at the request of her primary care physician, at which time the patient presented with new onset parkinsonian signs, right-sided predominant. She had significant gait impairment. She had associated hallucinations and memory loss. I suggested we start her on low-dose Sinemet starting with 25-100 milligrams strength, half a pill twice a day for about a week and then increase it to have a pill 3 times a day thereafter. She was advised to use her walker at all times but needed assistance despite the walker.   Today, 05/17/2015: She reports no significant new complaints. Her daughter provides almost all of her history. She has noted improved mobility. She had some physical therapy at her nursing home. Daughter notes that there are instances of confusion and hallucinations which are not new. Her Risperdal was recently increased from 0.25 mg daily at bedtime to 0.5 mg daily at bedtime. I do not see any other medication changes. Her daughter reports that her mother's appetite is not as good. The patient reports that she has heartburn. She did take a fall recently but was not using her walker at the time per Sherry Fox.  Previously:   She has been noted to have right-sided parkinsonian signs. I reviewed the hospital records. I reviewed her discharge summary. She lives in assisted living and was  discharged to [facility] skilled nursing facility. She has fallen. She had a head CT without contrast as well as a cervical spine CT without contrast after she had fallen. I reviewed the test results: No acute intracranial abnormality. Stable atrophy and chronic white matter disease. No cervical spine acute fracture or subluxation. Mild degenerative changes. In addition, I personally reviewed the images through the PACS system.  Her history is primarily provided by Sherry Fox. Her daughter recalls that about 2 years ago the patient started having problems with her walking. At the time, she was residing in [facility] in [place], [place] for about 9 months, but was falling and eventually had to be taken to the hospital. About a year ago she started having of right hand tremor. About 8 or 9 months ago she started having hallucinations and memory loss. Since she has been at the skilled nursing facility she has been going through therapy. She's not able to walk with her walker by herself. She did not bring her walker today and is situated in a nursing home wheelchair and was brought in by wheelchair [place] transportation. Of note, her other daughter, Sherry Fox, age 58, has a history of Parkinson's disease for years. She herself has never been on treatment for Parkinson's disease and was not previously diagnosed. She has been on Risperdal. As I understand, this was started when she was in assisted living and had problems sleeping at night. She is still on it, 0.5 mg each night.  Her Past Medical History Is Significant For: Past Medical History  Diagnosis Date  . Arthritis   . Hypercholesteremia   .  Hypertension   . Anxiety   . CKD (chronic kidney disease) stage 3, GFR 30-59 ml/min 01/15/2015  . Cellulitis of left leg 01/14/2015  . Diastolic dysfunction 0/76/2263    EF 60-65%. Grade 1 diastolic dysfunction.  . Dementia   . CHF (congestive heart failure)   . Anemia   . Hypokalemia   . Psychoses   . Major  depressive disorder   . Muscle weakness   . Cardiac murmur   . Dysphagia     Her Past Surgical History Is Significant For: Past Surgical History  Procedure Laterality Date  . Kidney cyst removal      Her Family History Is Significant For: Family History  Problem Relation Age of Onset  . Diabetes Father     Her Social History Is Significant For: History   Social History  . Marital Status: Widowed    Spouse Name: N/A  . Number of Children: 2  . Years of Education: 10th grade   Social History Main Topics  . Smoking status: Never Smoker   . Smokeless tobacco: Not on file  . Alcohol Use: No  . Drug Use: No  . Sexual Activity: Not on file   Other Topics Concern  . None   Social History Narrative   1 Mt Dew a day     Her Allergies Are:  Allergies  Allergen Reactions  . Macrobid [Nitrofurantoin Monohyd Macro] Rash  . Meloxicam Rash and Other (See Comments)    Reaction is unknown  :   Her Current Medications Are:  Outpatient Encounter Prescriptions as of 05/17/2015  Medication Sig  . carbidopa-levodopa (SINEMET IR) 25-100 MG per tablet Take 1/2 pill twice daily x 1 week, then 1/2 pill 3 times a day thereafter.  . cetirizine (ZYRTEC) 10 MG tablet Take 1 tablet (10 mg total) by mouth daily.  . citalopram (CELEXA) 20 MG tablet Take 20 mg by mouth daily. anxiety  . furosemide (LASIX) 20 MG tablet Take 20 mg by mouth.  . naproxen (NAPROSYN) 375 MG tablet   . potassium chloride (K-DUR,KLOR-CON) 10 MEQ tablet Take 1 tablet (10 mEq total) by mouth daily.  . risperiDONE (RISPERDAL) 0.5 MG tablet   . [DISCONTINUED] lisinopril-hydrochlorothiazide (PRINZIDE,ZESTORETIC) 20-25 MG per tablet Take 0.5 tablets by mouth daily. RESTART IN 3 DAYS   Facility-Administered Encounter Medications as of 05/17/2015  Medication  . cyanocobalamin ((VITAMIN B-12)) injection 1,000 mcg  :  Review of Systems:  Out of a complete 14 point review of systems, all are reviewed and negative with the  exception of these symptoms as listed below:   Review of Systems  Constitutional: Positive for appetite change.  Neurological:       Daughter reports increased confusion. Patient may have been restarted on Aricept.     Objective:  Neurologic Exam  Physical Exam Physical Examination:   Filed Vitals:   05/17/15 1308  BP: 124/68  Pulse: 72  Resp: 14    General Examination: The patient is a very pleasant 81 y.o. female in no acute distress.  HEENT: Normocephalic, atraumatic, pupils are equal, round and reactive to light and accommodation. Funduscopic exam is normal with sharp disc margins noted. Extraocular tracking shows moderate saccadic breakdown without nystagmus noted. There is gaze limitation to entire area gaze. There is mild decrease in eye blink rate. Hearing is intact. Face is symmetric with moderate facial masking and normal facial sensation. There is no lip, neck or jaw tremor. Neck is moderately rigid with intact passive ROM. There  are no carotid bruits on auscultation. Oropharynx exam reveals mild mouth dryness. No significant airway crowding is noted. Mallampati is class II. Tongue protrudes centrally and palate elevates symmetrically.   There is no drooling.   Chest: is clear to auscultation without wheezing, rhonchi or crackles noted.  Heart: sounds are regular and normal without murmurs, rubs or gallops noted.   Abdomen: is soft, non-tender and non-distended with normal bowel sounds appreciated on auscultation.  Extremities: There is 1-2+ pitting edema in the distal lower extremities bilaterally, L more than R. Pedal pulses are intact. Chronic stasis-like changes are noted in the distal legs bilaterally. There are no varicose veins.  Skin: is warm and dry with no trophic changes noted. Age-related changes are noted on the skin.   Musculoskeletal: exam reveals no obvious joint deformities, tenderness, joint swelling or erythema.  Neurologically:  Mental status: The  patient is not fully able to follow just verbal commands. She is able to mimic better than last time. The patient is awake and alert, paying fair attention. She is unable to fully provide the history. Her daughter provides almost the entire history. She is oriented to: person, place, situation and month of year. Her memory, attention, language and knowledge are impaired. There is no aphasia, agnosia, apraxia or anomia. There is a mild degree of bradyphrenia. Speech is moderately hypophonic with no dysarthria noted. Mood is congruent and affect is better.    Cranial nerves are as described above under HEENT exam. In addition, shoulder shrug is normal with equal shoulder height noted.  Motor exam: Normal bulk, and strength for age is noted. There are no dyskinesias noted.   Tone is mildly rigid with presence of cogwheeling in the right upper extremity. There is overall moderate bradykinesia. There is no drift or rebound.  There is an intermittent mild right upper extremity resting tremor. She has no postural or action tremor. She has moderate difficulty with fine motor skills on the right. She has mild to moderate difficulty on the left. She is able to stand with little assistance and she has a rolling walker. She walks slowly and turns slow, manuevering the walker okay. Sensory exam is intact to light touch.  Cerebellar testing shows no dysmetria or intention tremor on finger to nose testing. Heel to shin is not possible for her.   Assessment and Plan:   In summary, SHAWN DANNENBERG is a very pleasant 81 year old female with an underlying medical history of hypertension, depression, allergies, arthritis, and obesity, as well as hospitalization from 01/14/2015 through 01/17/2015 for cellulitis, altered mental status, low BP, dehydration and concern for sepsis secondary to cellulitis, complicated by acute kidney injury superimposed on chronic kidney disease, who presents for follow up consultation of her  parkinsonism, with some right sided predominance. She has a family history of Parkinson's disease in her 79 year old daughter. I started her on Sinemet low dose which is currently half a pill 3 times a day of the 25-100 milligrams strength. She has been able to tolerate this. Her mobility seems a little better. She has ongoing issues with confusion, agitation, and hallucinations. She has a history of memory loss. Her swelling seems improved in her lower extremities. I suggested they continue with Sinemet at the current dose. I asked her daughter to bring up the heartburn issue with the patient's primary care physician. The patient is advised to use her walker at all times. She continues to be at fall risk. I will see her back in 4-5  months, sooner if needed. I answered all their questions today and the patient and her daughter were in agreement. I spent 20 minutes in total face-to-face time with the patient, more than 50% of which was spent in counseling and coordination of care, reviewing test results, reviewing medication and discussing or reviewing the diagnosis of parkinsonism, the prognosis and treatment options.

## 2015-05-18 DIAGNOSIS — M6281 Muscle weakness (generalized): Secondary | ICD-10-CM | POA: Diagnosis not present

## 2015-05-19 DIAGNOSIS — M6281 Muscle weakness (generalized): Secondary | ICD-10-CM | POA: Diagnosis not present

## 2015-05-20 DIAGNOSIS — M6281 Muscle weakness (generalized): Secondary | ICD-10-CM | POA: Diagnosis not present

## 2015-05-21 DIAGNOSIS — M6281 Muscle weakness (generalized): Secondary | ICD-10-CM | POA: Diagnosis not present

## 2015-05-22 DIAGNOSIS — M6281 Muscle weakness (generalized): Secondary | ICD-10-CM | POA: Diagnosis not present

## 2015-05-22 DIAGNOSIS — N390 Urinary tract infection, site not specified: Secondary | ICD-10-CM | POA: Diagnosis not present

## 2015-05-23 DIAGNOSIS — M6281 Muscle weakness (generalized): Secondary | ICD-10-CM | POA: Diagnosis not present

## 2015-05-24 ENCOUNTER — Telehealth: Payer: Self-pay | Admitting: Pharmacist

## 2015-05-24 DIAGNOSIS — M6281 Muscle weakness (generalized): Secondary | ICD-10-CM | POA: Diagnosis not present

## 2015-05-24 NOTE — Telephone Encounter (Signed)
I reviewing reports from patient's insurance [employer] I noticed that she was taking risperdal and that she recently had a visit with neurologist for parkinson's symptoms.  I am concerned about patient's use of risperdal for 2 reasons.  It can worsen parkinson's symtoms and dyskinesia/ motor symptoms.   Also is it recommended to use risperdal with caution in patient's over 65 due. Elderly patients with dementia-related psychosis treated with antipsychotic drugs are at an increased risk of death. Risperidone is not approved for the treatment of patients with dementia-related psychosis. I tried to call patient's daughter to discuss but had to leave message.

## 2015-05-28 DIAGNOSIS — M25551 Pain in right hip: Secondary | ICD-10-CM | POA: Diagnosis not present

## 2015-05-28 DIAGNOSIS — M25561 Pain in right knee: Secondary | ICD-10-CM | POA: Diagnosis not present

## 2015-05-28 DIAGNOSIS — M79651 Pain in right thigh: Secondary | ICD-10-CM | POA: Diagnosis not present

## 2015-05-28 DIAGNOSIS — M6281 Muscle weakness (generalized): Secondary | ICD-10-CM | POA: Diagnosis not present

## 2015-05-29 DIAGNOSIS — M6281 Muscle weakness (generalized): Secondary | ICD-10-CM | POA: Diagnosis not present

## 2015-05-30 DIAGNOSIS — M6281 Muscle weakness (generalized): Secondary | ICD-10-CM | POA: Diagnosis not present

## 2015-05-31 DIAGNOSIS — M6281 Muscle weakness (generalized): Secondary | ICD-10-CM | POA: Diagnosis not present

## 2015-06-01 DIAGNOSIS — M6281 Muscle weakness (generalized): Secondary | ICD-10-CM | POA: Diagnosis not present

## 2015-06-02 DIAGNOSIS — N183 Chronic kidney disease, stage 3 (moderate): Secondary | ICD-10-CM | POA: Diagnosis not present

## 2015-06-02 DIAGNOSIS — L03115 Cellulitis of right lower limb: Secondary | ICD-10-CM | POA: Diagnosis not present

## 2015-06-02 DIAGNOSIS — A4181 Sepsis due to Enterococcus: Secondary | ICD-10-CM | POA: Diagnosis not present

## 2015-06-02 DIAGNOSIS — M6250 Muscle wasting and atrophy, not elsewhere classified, unspecified site: Secondary | ICD-10-CM | POA: Diagnosis not present

## 2015-06-02 DIAGNOSIS — I10 Essential (primary) hypertension: Secondary | ICD-10-CM | POA: Diagnosis not present

## 2015-06-03 DIAGNOSIS — M6281 Muscle weakness (generalized): Secondary | ICD-10-CM | POA: Diagnosis not present

## 2015-06-05 DIAGNOSIS — M6281 Muscle weakness (generalized): Secondary | ICD-10-CM | POA: Diagnosis not present

## 2015-06-06 DIAGNOSIS — M6281 Muscle weakness (generalized): Secondary | ICD-10-CM | POA: Diagnosis not present

## 2015-06-07 DIAGNOSIS — M6281 Muscle weakness (generalized): Secondary | ICD-10-CM | POA: Diagnosis not present

## 2015-06-08 DIAGNOSIS — M6281 Muscle weakness (generalized): Secondary | ICD-10-CM | POA: Diagnosis not present

## 2015-06-11 DIAGNOSIS — M6281 Muscle weakness (generalized): Secondary | ICD-10-CM | POA: Diagnosis not present

## 2015-06-12 DIAGNOSIS — M6281 Muscle weakness (generalized): Secondary | ICD-10-CM | POA: Diagnosis not present

## 2015-06-13 DIAGNOSIS — M6281 Muscle weakness (generalized): Secondary | ICD-10-CM | POA: Diagnosis not present

## 2015-06-14 DIAGNOSIS — M6281 Muscle weakness (generalized): Secondary | ICD-10-CM | POA: Diagnosis not present

## 2015-06-15 DIAGNOSIS — M6281 Muscle weakness (generalized): Secondary | ICD-10-CM | POA: Diagnosis not present

## 2015-06-15 DIAGNOSIS — N390 Urinary tract infection, site not specified: Secondary | ICD-10-CM | POA: Diagnosis not present

## 2015-06-18 DIAGNOSIS — M6281 Muscle weakness (generalized): Secondary | ICD-10-CM | POA: Diagnosis not present

## 2015-06-19 DIAGNOSIS — M6281 Muscle weakness (generalized): Secondary | ICD-10-CM | POA: Diagnosis not present

## 2015-06-20 DIAGNOSIS — M6281 Muscle weakness (generalized): Secondary | ICD-10-CM | POA: Diagnosis not present

## 2015-06-21 DIAGNOSIS — M6281 Muscle weakness (generalized): Secondary | ICD-10-CM | POA: Diagnosis not present

## 2015-06-22 DIAGNOSIS — M6281 Muscle weakness (generalized): Secondary | ICD-10-CM | POA: Diagnosis not present

## 2015-06-25 DIAGNOSIS — M6281 Muscle weakness (generalized): Secondary | ICD-10-CM | POA: Diagnosis not present

## 2015-06-26 DIAGNOSIS — M6281 Muscle weakness (generalized): Secondary | ICD-10-CM | POA: Diagnosis not present

## 2015-06-27 DIAGNOSIS — M6281 Muscle weakness (generalized): Secondary | ICD-10-CM | POA: Diagnosis not present

## 2015-06-28 DIAGNOSIS — M6281 Muscle weakness (generalized): Secondary | ICD-10-CM | POA: Diagnosis not present

## 2015-06-29 DIAGNOSIS — M6281 Muscle weakness (generalized): Secondary | ICD-10-CM | POA: Diagnosis not present

## 2015-07-01 DIAGNOSIS — M6281 Muscle weakness (generalized): Secondary | ICD-10-CM | POA: Diagnosis not present

## 2015-07-02 DIAGNOSIS — M6281 Muscle weakness (generalized): Secondary | ICD-10-CM | POA: Diagnosis not present

## 2015-07-03 DIAGNOSIS — M6281 Muscle weakness (generalized): Secondary | ICD-10-CM | POA: Diagnosis not present

## 2015-07-04 DIAGNOSIS — M6281 Muscle weakness (generalized): Secondary | ICD-10-CM | POA: Diagnosis not present

## 2015-07-05 DIAGNOSIS — I10 Essential (primary) hypertension: Secondary | ICD-10-CM | POA: Diagnosis not present

## 2015-07-05 DIAGNOSIS — L03115 Cellulitis of right lower limb: Secondary | ICD-10-CM | POA: Diagnosis not present

## 2015-07-05 DIAGNOSIS — M6281 Muscle weakness (generalized): Secondary | ICD-10-CM | POA: Diagnosis not present

## 2015-07-05 DIAGNOSIS — M6250 Muscle wasting and atrophy, not elsewhere classified, unspecified site: Secondary | ICD-10-CM | POA: Diagnosis not present

## 2015-07-05 DIAGNOSIS — A4181 Sepsis due to Enterococcus: Secondary | ICD-10-CM | POA: Diagnosis not present

## 2015-07-05 DIAGNOSIS — N183 Chronic kidney disease, stage 3 (moderate): Secondary | ICD-10-CM | POA: Diagnosis not present

## 2015-08-04 DIAGNOSIS — M6250 Muscle wasting and atrophy, not elsewhere classified, unspecified site: Secondary | ICD-10-CM | POA: Diagnosis not present

## 2015-08-04 DIAGNOSIS — A4181 Sepsis due to Enterococcus: Secondary | ICD-10-CM | POA: Diagnosis not present

## 2015-08-04 DIAGNOSIS — I10 Essential (primary) hypertension: Secondary | ICD-10-CM | POA: Diagnosis not present

## 2015-08-04 DIAGNOSIS — N183 Chronic kidney disease, stage 3 (moderate): Secondary | ICD-10-CM | POA: Diagnosis not present

## 2015-08-04 DIAGNOSIS — L03115 Cellulitis of right lower limb: Secondary | ICD-10-CM | POA: Diagnosis not present

## 2015-08-07 DIAGNOSIS — M6281 Muscle weakness (generalized): Secondary | ICD-10-CM | POA: Diagnosis not present

## 2015-08-09 DIAGNOSIS — N390 Urinary tract infection, site not specified: Secondary | ICD-10-CM | POA: Diagnosis not present

## 2015-08-09 DIAGNOSIS — M6281 Muscle weakness (generalized): Secondary | ICD-10-CM | POA: Diagnosis not present

## 2015-08-10 DIAGNOSIS — M6281 Muscle weakness (generalized): Secondary | ICD-10-CM | POA: Diagnosis not present

## 2015-08-13 DIAGNOSIS — M6281 Muscle weakness (generalized): Secondary | ICD-10-CM | POA: Diagnosis not present

## 2015-08-14 DIAGNOSIS — M6281 Muscle weakness (generalized): Secondary | ICD-10-CM | POA: Diagnosis not present

## 2015-08-15 DIAGNOSIS — M6281 Muscle weakness (generalized): Secondary | ICD-10-CM | POA: Diagnosis not present

## 2015-08-16 DIAGNOSIS — M6281 Muscle weakness (generalized): Secondary | ICD-10-CM | POA: Diagnosis not present

## 2015-08-17 DIAGNOSIS — M6281 Muscle weakness (generalized): Secondary | ICD-10-CM | POA: Diagnosis not present

## 2015-08-17 DIAGNOSIS — N390 Urinary tract infection, site not specified: Secondary | ICD-10-CM | POA: Diagnosis not present

## 2015-08-20 DIAGNOSIS — Z79899 Other long term (current) drug therapy: Secondary | ICD-10-CM | POA: Diagnosis not present

## 2015-08-20 DIAGNOSIS — M6281 Muscle weakness (generalized): Secondary | ICD-10-CM | POA: Diagnosis not present

## 2015-10-18 ENCOUNTER — Ambulatory Visit: Payer: Medicare Other | Admitting: Neurology

## 2015-12-25 ENCOUNTER — Other Ambulatory Visit (HOSPITAL_COMMUNITY): Payer: Self-pay | Admitting: Internal Medicine

## 2015-12-25 DIAGNOSIS — N939 Abnormal uterine and vaginal bleeding, unspecified: Secondary | ICD-10-CM

## 2015-12-28 ENCOUNTER — Ambulatory Visit (HOSPITAL_COMMUNITY)
Admission: RE | Admit: 2015-12-28 | Discharge: 2015-12-28 | Disposition: A | Discharge status: Home / Self Care | Payer: Medicare Other | Source: Ambulatory Visit | Attending: Internal Medicine | Admitting: Internal Medicine

## 2015-12-28 ENCOUNTER — Ambulatory Visit (HOSPITAL_COMMUNITY): Admission: RE | Admit: 2015-12-28 | Payer: Medicare Other | Source: Ambulatory Visit

## 2015-12-28 DIAGNOSIS — R938 Abnormal findings on diagnostic imaging of other specified body structures: Secondary | ICD-10-CM | POA: Insufficient documentation

## 2015-12-28 DIAGNOSIS — N939 Abnormal uterine and vaginal bleeding, unspecified: Secondary | ICD-10-CM | POA: Diagnosis not present

## 2016-03-05 ENCOUNTER — Emergency Department (HOSPITAL_COMMUNITY)
Admission: EM | Admit: 2016-03-05 | Discharge: 2016-03-05 | Disposition: A | Discharge status: Home / Self Care | Payer: Medicare Other | Attending: Emergency Medicine | Admitting: Emergency Medicine

## 2016-03-05 ENCOUNTER — Encounter (HOSPITAL_COMMUNITY): Payer: Self-pay | Admitting: Emergency Medicine

## 2016-03-05 DIAGNOSIS — Y929 Unspecified place or not applicable: Secondary | ICD-10-CM | POA: Diagnosis not present

## 2016-03-05 DIAGNOSIS — S0181XA Laceration without foreign body of other part of head, initial encounter: Secondary | ICD-10-CM

## 2016-03-05 DIAGNOSIS — S0083XA Contusion of other part of head, initial encounter: Secondary | ICD-10-CM

## 2016-03-05 DIAGNOSIS — W19XXXA Unspecified fall, initial encounter: Secondary | ICD-10-CM | POA: Insufficient documentation

## 2016-03-05 DIAGNOSIS — M1990 Unspecified osteoarthritis, unspecified site: Secondary | ICD-10-CM | POA: Diagnosis not present

## 2016-03-05 DIAGNOSIS — Y939 Activity, unspecified: Secondary | ICD-10-CM | POA: Insufficient documentation

## 2016-03-05 DIAGNOSIS — Y999 Unspecified external cause status: Secondary | ICD-10-CM | POA: Diagnosis not present

## 2016-03-05 DIAGNOSIS — I130 Hypertensive heart and chronic kidney disease with heart failure and stage 1 through stage 4 chronic kidney disease, or unspecified chronic kidney disease: Secondary | ICD-10-CM | POA: Diagnosis not present

## 2016-03-05 DIAGNOSIS — F329 Major depressive disorder, single episode, unspecified: Secondary | ICD-10-CM | POA: Diagnosis not present

## 2016-03-05 DIAGNOSIS — I509 Heart failure, unspecified: Secondary | ICD-10-CM | POA: Diagnosis not present

## 2016-03-05 DIAGNOSIS — F0390 Unspecified dementia without behavioral disturbance: Secondary | ICD-10-CM | POA: Insufficient documentation

## 2016-03-05 DIAGNOSIS — N183 Chronic kidney disease, stage 3 (moderate): Secondary | ICD-10-CM | POA: Insufficient documentation

## 2016-03-05 HISTORY — DX: Chronic pulmonary edema: J81.1

## 2016-03-05 MED ORDER — LIDOCAINE-EPINEPHRINE (PF) 2 %-1:200000 IJ SOLN
10.0000 mL | Freq: Once | INTRAMUSCULAR | Status: DC
  Filled 2016-03-05: qty 20

## 2016-03-05 MED ORDER — TETANUS-DIPHTH-ACELL PERTUSSIS 5-2.5-18.5 LF-MCG/0.5 IM SUSP: 0.5000 mL | Freq: Once | INTRAMUSCULAR | Status: DC

## 2016-03-05 NOTE — ED Notes (Signed)
Dr Steinl at bedside,  

## 2016-03-05 NOTE — Discharge Instructions (Signed)
It was our pleasure to provide your ER care today - we hope that you feel better.  Ice/coldpack to sore/swollen area.  Keep wound area very clean.  Take tylenol as need.  Follow up with your doctor, or urgent care, in 7-9 days for suture removal.   Return to ER if worse, new symptoms, new or severe pain, infection of wound, persistent vomiting, change in mental status, other concern.     Facial Laceration  A facial laceration is a cut on the face. These injuries can be painful and cause bleeding. Lacerations usually heal quickly, but they need special care to reduce scarring. DIAGNOSIS  Your health care provider will take a medical history, ask for details about how the injury occurred, and examine the wound to determine how deep the cut is. TREATMENT  Some facial lacerations may not require closure. Others may not be able to be closed because of an increased risk of infection. The risk of infection and the chance for successful closure will depend on various factors, including the amount of time since the injury occurred. The wound may be cleaned to help prevent infection. If closure is appropriate, pain medicines may be given if needed. Your health care provider will use stitches (sutures), wound glue (adhesive), or skin adhesive strips to repair the laceration. These tools bring the skin edges together to allow for faster healing and a better cosmetic outcome. If needed, you may also be given a tetanus shot. HOME CARE INSTRUCTIONS  Only take over-the-counter or prescription medicines as directed by your health care provider.  Follow your health care provider's instructions for wound care. These instructions will vary depending on the technique used for closing the wound. For Sutures:  Keep the wound clean and dry.   If you were given a bandage (dressing), you should change it at least once a day. Also change the dressing if it becomes wet or dirty, or as directed by your health care  provider.   Wash the wound with soap and water 2 times a day. Rinse the wound off with water to remove all soap. Pat the wound dry with a clean towel.   After cleaning, apply a thin layer of the antibiotic ointment recommended by your health care provider. This will help prevent infection and keep the dressing from sticking.   You may shower as usual after the first 24 hours. Do not soak the wound in water until the sutures are removed.   Get your sutures removed as directed by your health care provider. With facial lacerations, sutures should usually be taken out after 4-5 days to avoid stitch marks.   Wait a few days after your sutures are removed before applying any makeup. For Skin Adhesive Strips:  Keep the wound clean and dry.   Do not get the skin adhesive strips wet. You may bathe carefully, using caution to keep the wound dry.   If the wound gets wet, pat it dry with a clean towel.   Skin adhesive strips will fall off on their own. You may trim the strips as the wound heals. Do not remove skin adhesive strips that are still stuck to the wound. They will fall off in time.  For Wound Adhesive:  You may briefly wet your wound in the shower or bath. Do not soak or scrub the wound. Do not swim. Avoid periods of heavy sweating until the skin adhesive has fallen off on its own. After showering or bathing, gently pat the wound  dry with a clean towel.   Do not apply liquid medicine, cream medicine, ointment medicine, or makeup to your wound while the skin adhesive is in place. This may loosen the film before your wound is healed.   If a dressing is placed over the wound, be careful not to apply tape directly over the skin adhesive. This may cause the adhesive to be pulled off before the wound is healed.   Avoid prolonged exposure to sunlight or tanning lamps while the skin adhesive is in place.  The skin adhesive will usually remain in place for 5-10 days, then naturally  fall off the skin. Do not pick at the adhesive film.  After Healing: Once the wound has healed, cover the wound with sunscreen during the day for 1 full year. This can help minimize scarring. Exposure to ultraviolet light in the first year will darken the scar. It can take 1-2 years for the scar to lose its redness and to heal completely.  SEEK MEDICAL CARE IF:  You have a fever. SEEK IMMEDIATE MEDICAL CARE IF:  You have redness, pain, or swelling around the wound.   You see ayellowish-white fluid (pus) coming from the wound.    This information is not intended to replace advice given to you by your health care provider. Make sure you discuss any questions you have with your health care provider.   Document Released: 11/13/2004 Document Revised: 10/27/2014 Document Reviewed: 05/19/2013 Elsevier Interactive Patient Education 2016 [employer]    Head Injury, Adult You have received a head injury. It does not appear serious at this time. Headaches and vomiting are common following head injury. It should be easy to awaken from sleeping. Sometimes it is necessary for you to stay in the emergency department for a while for observation. Sometimes admission to the hospital may be needed. After injuries such as yours, most problems occur within the first 24 hours, but side effects may occur up to 7-10 days after the injury. It is important for you to carefully monitor your condition and contact your health care provider or seek immediate medical care if there is a change in your condition. WHAT ARE THE TYPES OF HEAD INJURIES? Head injuries can be as minor as a bump. Some head injuries can be more severe. More severe head injuries include:  A jarring injury to the brain (concussion).  A bruise of the brain (contusion). This mean there is bleeding in the brain that can cause swelling.  A cracked skull (skull fracture).  Bleeding in the brain that collects, clots, and forms a bump  (hematoma). WHAT CAUSES A HEAD INJURY? A serious head injury is most likely to happen to someone who is in a car wreck and is not wearing a seat belt. Other causes of major head injuries include bicycle or motorcycle accidents, sports injuries, and falls. HOW ARE HEAD INJURIES DIAGNOSED? A complete history of the event leading to the injury and your current symptoms will be helpful in diagnosing head injuries. Many times, pictures of the brain, such as CT or MRI are needed to see the extent of the injury. Often, an overnight hospital stay is necessary for observation.  WHEN SHOULD I SEEK IMMEDIATE MEDICAL CARE?  You should get help right away if:  You have confusion or drowsiness.  You feel sick to your stomach (nauseous) or have continued, forceful vomiting.  You have dizziness or unsteadiness that is getting worse.  You have severe, continued headaches not relieved by medicine. Only  take over-the-counter or prescription medicines for pain, fever, or discomfort as directed by your health care provider.  You do not have normal function of the arms or legs or are unable to walk.  You notice changes in the black spots in the center of the colored part of your eye (pupil).  You have a clear or bloody fluid coming from your nose or ears.  You have a loss of vision. During the next 24 hours after the injury, you must stay with someone who can watch you for the warning signs. This person should contact local emergency services (911 in the [place] if you have seizures, you become unconscious, or you are unable to wake up. HOW CAN I PREVENT A HEAD INJURY IN THE FUTURE? The most important factor for preventing major head injuries is avoiding motor vehicle accidents. To minimize the potential for damage to your head, it is crucial to wear seat belts while riding in motor vehicles. Wearing helmets while bike riding and playing collision sports (like football) is also helpful. Also, avoiding dangerous  activities around the house will further help reduce your risk of head injury.  WHEN CAN I RETURN TO NORMAL ACTIVITIES AND ATHLETICS? You should be reevaluated by your health care provider before returning to these activities. If you have any of the following symptoms, you should not return to activities or contact sports until 1 week after the symptoms have stopped:  Persistent headache.  Dizziness or vertigo.  Poor attention and concentration.  Confusion.  Memory problems.  Nausea or vomiting.  Fatigue or tire easily.  Irritability.  Intolerant of bright lights or loud noises.  Anxiety or depression.  Disturbed sleep. MAKE SURE YOU:   Understand these instructions.  Will watch your condition.  Will get help right away if you are not doing well or get worse.   This information is not intended to replace advice given to you by your health care provider. Make sure you discuss any questions you have with your health care provider.   Document Released: 10/06/2005 Document Revised: 10/27/2014 Document Reviewed: 06/13/2013 Elsevier Interactive Patient Education 2016 Else[employer] Inc[employer]     Cryotherapy Cryotherapy is when you put ice on your injury. Ice helps lessen pain and puffiness (swelling) after an injury. Ice works the best when you start using it in the first 24 to 48 hours after an injury. HOME CARE  Put a dry or damp towel between the ice pack and your skin.  You may press gently on the ice pack.  Leave the ice on for no more than 10 to 20 minutes at a time.  Check your skin after 5 minutes to make sure your skin is okay.  Rest at least 20 minutes between ice pack uses.  Stop using ice when your skin loses feeling (numbness).  Do not use ice on someone who cannot tell you when it hurts. This includes small children and people with memory problems (dementia). GET HELP RIGHT AWAY IF:  You have white spots on your skin.  Your skin turns blue or pale.  Your  skin feels waxy or hard.  Your puffiness gets worse. MAKE SURE YOU:   Understand these instructions.  Will watch your condition.  Will get help right away if you are not doing well or get worse.   This information is not intended to replace advice given to you by your health care provider. Make sure you discuss any questions you have with your health care provider.  Document Released: 03/24/2008 Document Revised: 12/29/2011 Document Reviewed: 05/29/2011 Elsevier Interactive Patient Education [employer]

## 2016-03-05 NOTE — ED Notes (Signed)
[facility] notified of pt needing transportation back to [facility].

## 2016-03-05 NOTE — ED Provider Notes (Signed)
CSN: [id number]     Arrival date & time 03/05/16  1832 History   First MD Initiated Contact with Patient 03/05/16 1837     Chief Complaint  Patient presents with  . Fall     (Consider location/radiation/quality/duration/timing/severity/associated sxs/prior Treatment) Patient is a 82 y.o. female presenting with fall. The history is provided by the patient and the EMS personnel. The history is limited by the condition of the patient.  Fall Pertinent negatives include no chest pain, no abdominal pain, no headaches and no shortness of breath.  Patient s/p fall at [facility].  No loc was noted, patients mental status described as remaining c/w her baseline, hx dementia.  Patient alert, content. Right facial laceration, otherwise patient denies pain or other c/o. Limited historian, dementia, level 5 caveat.  Pt denies faintness or loc. No headache. No neck or back pain. No chest pain or sob. No anticoag med use.      Past Medical History  Diagnosis Date  . Arthritis   . Hypercholesteremia   . Hypertension   . Anxiety   . CKD (chronic kidney disease) stage 3, GFR 30-59 ml/min 01/15/2015  . Cellulitis of left leg 01/14/2015  . Diastolic dysfunction 01/17/2015    EF 60-65%. Grade 1 diastolic dysfunction.  . Dementia   . CHF (congestive heart failure) (HCC)   . Anemia   . Hypokalemia   . Psychoses   . Major depressive disorder (HCC)   . Muscle weakness   . Cardiac murmur   . Dysphagia   . Pulmonary edema   . Hypokalemia    Past Surgical History  Procedure Laterality Date  . Kidney cyst removal     Family History  Problem Relation Age of Onset  . Diabetes Father    Social History  Substance Use Topics  . Smoking status: Never Smoker   . Smokeless tobacco: None  . Alcohol Use: No   OB History    No data available     Review of Systems  Unable to perform ROS: Dementia  Constitutional: Negative for fever.  Respiratory: Negative for shortness of breath.   Cardiovascular: Negative  for chest pain.  Gastrointestinal: Negative for abdominal pain.  Musculoskeletal: Negative for back pain and neck pain.  Neurological: Negative for headaches.  patient limited historian, dementia, level 5 caveat.     Allergies  Macrobid and Meloxicam  Home Medications   Prior to Admission medications   Medication Sig Start Date End Date Taking? Authorizing Provider  carbidopa-levodopa (SINEMET IR) 25-100 MG per tablet Take 1/2 pill twice daily x 1 week, then 1/2 pill 3 times a day thereafter. 02/13/15   Huston Foley, MD  cetirizine (ZYRTEC) 10 MG tablet Take 1 tablet (10 mg total) by mouth daily. 02/08/14   Deatra Canter, FNP  citalopram (CELEXA) 20 MG tablet Take 20 mg by mouth daily. anxiety    Historical Provider, MD  furosemide (LASIX) 20 MG tablet Take 20 mg by mouth.    Historical Provider, MD  naproxen (NAPROSYN) 375 MG tablet  04/27/15   Historical Provider, MD  potassium chloride (K-DUR,KLOR-CON) 10 MEQ tablet Take 1 tablet (10 mEq total) by mouth daily. 01/17/15   Elliot Cousin, MD  risperiDONE (RISPERDAL) 0.5 MG tablet  05/13/15   Historical Provider, MD   BP 140/68 mmHg  Pulse 80  Temp(Src) 98 F (36.7 C) (Oral)  Resp 18  SpO2 95% Physical Exam  Constitutional: She appears well-developed and well-nourished. No distress.  HENT:  Mouth/Throat: Oropharynx  is clear and moist.  3 cm right facial laceration. Facial bones/orbits grossly intact.   Eyes: Conjunctivae and EOM are normal. Pupils are equal, round, and reactive to light. No scleral icterus.  Neck: Normal range of motion. Neck supple. No tracheal deviation present.  No stiffness or rigidity  Cardiovascular: Normal rate, regular rhythm, normal heart sounds and intact distal pulses.   Pulmonary/Chest: Effort normal and breath sounds normal. No respiratory distress. She exhibits no tenderness.  Abdominal: Soft. Normal appearance and bowel sounds are normal. She exhibits no distension. There is no tenderness.   Musculoskeletal: She exhibits no edema.  CTLS spine, non tender, aligned, no step off. Good rom bil ext without pain or focal bony tenderness. Distal pulses palp bil.   Neurological: She is alert.  Alert, content. Moves bil ext purposefully w good strength, equal grips. No facial asymmetry or droop.   Skin: Skin is warm and dry. No rash noted.  Psychiatric: She has a normal mood and affect.  Nursing note and vitals reviewed.   ED Course  .Marland KitchenLaceration Repair Date/Time: 03/05/2016 7:21 PM Performed by: Cathren Laine Authorized by: Cathren Laine Consent: Verbal consent obtained. Body area: head/neck Laceration length: 3 cm Foreign bodies: no foreign bodies Anesthesia: local infiltration Local anesthetic: lidocaine 2% with epinephrine Anesthetic total: 4 ml Irrigation solution: saline Irrigation method: syringe Amount of cleaning: standard Skin closure: 5-0 Prolene Number of sutures: 5 Suture technique: interrupted. Dressing: 4x4 sterile gauze Patient tolerance: Patient tolerated the procedure well with no immediate complications   (including critical care time)    MDM   Patient alert, content, denies pain, mental status appears c/w baseline.  Small laceration to right face. Repaired.  Recheck spine non tender.  Patient remains content, alert, no distress.  Patient currently appears stable for d/c.       Cathren Laine, MD 03/05/16 [phone]

## 2016-03-05 NOTE — ED Notes (Signed)
Per EMS: Pt from [facility] had unwitnessed fall in room, pt was found laying in floor, pt has laceration over right eye, pt has dementia and unable to remember how she fell.  No active bleeding at this time. Pt not on blood thinners.

## 2016-03-05 NOTE — ED Notes (Signed)
Report given to [facility] RN aty [facility] in [place]< advised to contact ems for transport to their facility,

## 2016-04-14 ENCOUNTER — Encounter (HOSPITAL_COMMUNITY): Payer: Self-pay | Admitting: *Deleted

## 2016-04-14 ENCOUNTER — Emergency Department (HOSPITAL_COMMUNITY)
Admission: EM | Admit: 2016-04-14 | Discharge: 2016-04-14 | Disposition: A | Discharge status: Home / Self Care | Payer: Medicare Other | Attending: Emergency Medicine | Admitting: Emergency Medicine

## 2016-04-14 ENCOUNTER — Emergency Department (HOSPITAL_COMMUNITY): Payer: Medicare Other

## 2016-04-14 DIAGNOSIS — Y999 Unspecified external cause status: Secondary | ICD-10-CM | POA: Diagnosis not present

## 2016-04-14 DIAGNOSIS — M1990 Unspecified osteoarthritis, unspecified site: Secondary | ICD-10-CM | POA: Insufficient documentation

## 2016-04-14 DIAGNOSIS — N183 Chronic kidney disease, stage 3 (moderate): Secondary | ICD-10-CM | POA: Insufficient documentation

## 2016-04-14 DIAGNOSIS — I509 Heart failure, unspecified: Secondary | ICD-10-CM | POA: Insufficient documentation

## 2016-04-14 DIAGNOSIS — I130 Hypertensive heart and chronic kidney disease with heart failure and stage 1 through stage 4 chronic kidney disease, or unspecified chronic kidney disease: Secondary | ICD-10-CM | POA: Insufficient documentation

## 2016-04-14 DIAGNOSIS — W19XXXA Unspecified fall, initial encounter: Secondary | ICD-10-CM | POA: Diagnosis not present

## 2016-04-14 DIAGNOSIS — Z79899 Other long term (current) drug therapy: Secondary | ICD-10-CM | POA: Insufficient documentation

## 2016-04-14 DIAGNOSIS — Y939 Activity, unspecified: Secondary | ICD-10-CM | POA: Diagnosis not present

## 2016-04-14 DIAGNOSIS — Y92129 Unspecified place in nursing home as the place of occurrence of the external cause: Secondary | ICD-10-CM | POA: Diagnosis not present

## 2016-04-14 DIAGNOSIS — F0390 Unspecified dementia without behavioral disturbance: Secondary | ICD-10-CM | POA: Diagnosis not present

## 2016-04-14 DIAGNOSIS — R112 Nausea with vomiting, unspecified: Secondary | ICD-10-CM | POA: Insufficient documentation

## 2016-04-14 LAB — CBC WITH DIFFERENTIAL/PLATELET
Basophils Absolute: 0 K/uL (ref 0.0–0.1)
Basophils Relative: 0 %
Eosinophils Absolute: 0 K/uL (ref 0.0–0.7)
Eosinophils Relative: 0 %
HCT: 42.2 % (ref 36.0–46.0)
Hemoglobin: 13.7 g/dL (ref 12.0–15.0)
Lymphocytes Relative: 6 %
Lymphs Abs: 0.7 K/uL (ref 0.7–4.0)
MCH: 29.8 pg (ref 26.0–34.0)
MCHC: 32.5 g/dL (ref 30.0–36.0)
MCV: 91.9 fL (ref 78.0–100.0)
Monocytes Absolute: 0.4 K/uL (ref 0.1–1.0)
Monocytes Relative: 3 %
Neutro Abs: 10.5 K/uL — ABNORMAL HIGH (ref 1.7–7.7)
Neutrophils Relative %: 91 %
Platelets: 208 K/uL (ref 150–400)
RBC: 4.59 MIL/uL (ref 3.87–5.11)
RDW: 13.6 % (ref 11.5–15.5)
WBC: 11.6 K/uL — ABNORMAL HIGH (ref 4.0–10.5)

## 2016-04-14 LAB — LACTIC ACID, PLASMA: Lactic Acid, Venous: 1.1 mmol/L (ref 0.5–2.0)

## 2016-04-14 LAB — COMPREHENSIVE METABOLIC PANEL WITH GFR
ALT: 20 U/L (ref 14–54)
AST: 24 U/L (ref 15–41)
Albumin: 4.1 g/dL (ref 3.5–5.0)
Alkaline Phosphatase: 69 U/L (ref 38–126)
Anion gap: 10 (ref 5–15)
BUN: 19 mg/dL (ref 6–20)
CO2: 27 mmol/L (ref 22–32)
Calcium: 9.5 mg/dL (ref 8.9–10.3)
Chloride: 102 mmol/L (ref 101–111)
Creatinine, Ser: 0.79 mg/dL (ref 0.44–1.00)
GFR calc Af Amer: >60 mL/min
GFR calc non Af Amer: >60 mL/min
Glucose, Bld: 170 mg/dL — ABNORMAL HIGH (ref 65–99)
Potassium: 4 mmol/L (ref 3.5–5.1)
Sodium: 139 mmol/L (ref 135–145)
Total Bilirubin: 1.4 mg/dL — ABNORMAL HIGH (ref 0.3–1.2)
Total Protein: 7.1 g/dL (ref 6.5–8.1)

## 2016-04-14 LAB — TYPE AND SCREEN
ABO/RH(D): A NEG
ANTIBODY SCREEN: NEGATIVE

## 2016-04-14 LAB — I-STAT CG4 LACTIC ACID, ED: Lactic Acid, Venous: 1.26 mmol/L (ref 0.5–2.0)

## 2016-04-14 LAB — LIPASE, BLOOD: Lipase: 10 U/L — ABNORMAL LOW (ref 11–51)

## 2016-04-14 MED ORDER — DIPHENHYDRAMINE HCL 50 MG/ML IJ SOLN
INTRAMUSCULAR | Status: AC
  Filled 2016-04-14: qty 1

## 2016-04-14 MED ORDER — SODIUM CHLORIDE 0.9 % IV SOLN
1000.0000 mL | INTRAVENOUS | Status: DC
  Administered 2016-04-14: 1000 mL via INTRAVENOUS

## 2016-04-14 MED ORDER — ONDANSETRON HCL 4 MG/2ML IJ SOLN
4.0000 mg | Freq: Once | INTRAMUSCULAR | Status: AC
  Administered 2016-04-14: 4 mg via INTRAVENOUS
  Filled 2016-04-14: qty 2

## 2016-04-14 MED ORDER — ONDANSETRON 8 MG PO TBDP: 8.0000 mg | ORAL_TABLET | Freq: Three times a day (TID) | ORAL | Status: DC | PRN

## 2016-04-14 MED ORDER — PANTOPRAZOLE SODIUM 40 MG PO TBEC: 40.0000 mg | DELAYED_RELEASE_TABLET | Freq: Every day | ORAL | Status: DC

## 2016-04-14 MED ORDER — DIPHENHYDRAMINE HCL 50 MG/ML IJ SOLN
25.0000 mg | Freq: Once | INTRAMUSCULAR | Status: AC
  Administered 2016-04-14: 25 mg via INTRAVENOUS

## 2016-04-14 MED ORDER — SODIUM CHLORIDE 0.9 % IV SOLN
1000.0000 mL | Freq: Once | INTRAVENOUS | Status: AC
  Administered 2016-04-14: 1000 mL via INTRAVENOUS

## 2016-04-14 MED ORDER — METOCLOPRAMIDE HCL 5 MG/ML IJ SOLN
10.0000 mg | Freq: Once | INTRAMUSCULAR | Status: AC
  Administered 2016-04-14: 10 mg via INTRAVENOUS

## 2016-04-14 MED ORDER — PANTOPRAZOLE SODIUM 40 MG PO TBEC
40.0000 mg | DELAYED_RELEASE_TABLET | Freq: Once | ORAL | Status: DC
  Filled 2016-04-14: qty 1

## 2016-04-14 MED ORDER — METOCLOPRAMIDE HCL 5 MG/ML IJ SOLN
INTRAMUSCULAR | Status: AC
  Filled 2016-04-14: qty 2

## 2016-04-14 NOTE — ED Notes (Signed)
Patient awake able to drink water and eat some applesauce. No vomiting. EMS called for transport back to [facility].

## 2016-04-14 NOTE — ED Notes (Signed)
Pt very sleepy. With drawls to sternal rub. Vital signs stable. Daughter at bedside. EDP aware. Will let patient remain in ED and sleep off effects of IV medications.

## 2016-04-14 NOTE — ED Notes (Signed)
Pt remains very sleepy. Stirs and mumbles to sternal rub and applying cool damp cloth to face. Daughter at bedside, agrees pt should be awake and taking po prior to d/c.

## 2016-04-14 NOTE — ED Notes (Signed)
Pt arousalable to tactile stimuli.  Tolerating sips of clear liquids.

## 2016-04-14 NOTE — ED Notes (Signed)
Report called to Dewayne Hatch, LPN at [facility].

## 2016-04-14 NOTE — ED Notes (Signed)
[facility], adult services called to inform of pt situation, waiting for call back

## 2016-04-14 NOTE — Discharge Instructions (Signed)
Nausea and Vomiting °Nausea is a sick feeling that often comes before throwing up (vomiting). Vomiting is a reflex where stomach contents come out of your mouth. Vomiting can cause severe loss of body fluids (dehydration). Children and elderly adults can become dehydrated quickly, especially if they also have diarrhea. Nausea and vomiting are symptoms of a condition or disease. It is important to find the cause of your symptoms. °CAUSES  °· Direct irritation of the stomach lining. This irritation can result from increased acid production (gastroesophageal reflux disease), infection, food poisoning, taking certain medicines (such as nonsteroidal anti-inflammatory drugs), alcohol use, or tobacco use. °· Signals from the brain. These signals could be caused by a headache, heat exposure, an inner ear disturbance, increased pressure in the brain from injury, infection, a tumor, or a concussion, pain, emotional stimulus, or metabolic problems. °· An obstruction in the gastrointestinal tract (bowel obstruction). °· Illnesses such as diabetes, hepatitis, gallbladder problems, appendicitis, kidney problems, cancer, sepsis, atypical symptoms of a heart attack, or eating disorders. °· Medical treatments such as chemotherapy and radiation. °· Receiving medicine that makes you sleep (general anesthetic) during surgery. °DIAGNOSIS °Your caregiver may ask for tests to be done if the problems do not improve after a few days. Tests may also be done if symptoms are severe or if the reason for the nausea and vomiting is not clear. Tests may include: °· Urine tests. °· Blood tests. °· Stool tests. °· Cultures (to look for evidence of infection). °· X-rays or other imaging studies. °Test results can help your caregiver make decisions about treatment or the need for additional tests. °TREATMENT °You need to stay well hydrated. Drink frequently but in small amounts. You may wish to drink water, sports drinks, clear broth, or eat frozen  ice pops or gelatin dessert to help stay hydrated. When you eat, eating slowly may help prevent nausea. There are also some antinausea medicines that may help prevent nausea. °HOME CARE INSTRUCTIONS  °· Take all medicine as directed by your caregiver. °· If you do not have an appetite, do not force yourself to eat. However, you must continue to drink fluids. °· If you have an appetite, eat a normal diet unless your caregiver tells you differently. °¨ Eat a variety of complex carbohydrates (rice, wheat, potatoes, bread), lean meats, yogurt, fruits, and vegetables. °¨ Avoid high-fat foods because they are more difficult to digest. °· Drink enough water and fluids to keep your urine clear or pale yellow. °· If you are dehydrated, ask your caregiver for specific rehydration instructions. Signs of dehydration may include: °¨ Severe thirst. °¨ Dry lips and mouth. °¨ Dizziness. °¨ Dark urine. °¨ Decreasing urine frequency and amount. °¨ Confusion. °¨ Rapid breathing or pulse. °SEEK IMMEDIATE MEDICAL CARE IF:  °· You have blood or brown flecks (like coffee grounds) in your vomit. °· You have black or bloody stools. °· You have a severe headache or stiff neck. °· You are confused. °· You have severe abdominal pain. °· You have chest pain or trouble breathing. °· You do not urinate at least once every 8 hours. °· You develop cold or clammy skin. °· You continue to vomit for longer than 24 to 48 hours. °· You have a fever. °MAKE SURE YOU:  °· Understand these instructions. °· Will watch your condition. °· Will get help right away if you are not doing well or get worse. °  °This information is not intended to replace advice given to you by your health care provider. Make sure   you discuss any questions you have with your health care provider.   Document Released: 10/06/2005 Document Revised: 12/29/2011 Document Reviewed: 03/05/2011 Elsevier Interactive Patient Education 2016 [employer]  Pantoprazole tablets What is  this medicine? PANTOPRAZOLE (pan TOE pra zole) prevents the production of acid in the stomach. It is used to treat gastroesophageal reflux disease (GERD), inflammation of the esophagus, and Zollinger-Ellison syndrome. This medicine may be used for other purposes; ask your health care provider or pharmacist if you have questions. What should I tell my health care provider before I take this medicine? They need to know if you have any of these conditions: -liver disease -low levels of magnesium in the blood -an unusual or allergic reaction to omeprazole, lansoprazole, pantoprazole, rabeprazole, other medicines, foods, dyes, or preservatives -pregnant or trying to get pregnant -breast-feeding How should I use this medicine? Take this medicine by mouth. Swallow the tablets whole with a drink of water. Follow the directions on the prescription label. Do not crush, break, or chew. Take your medicine at regular intervals. Do not take your medicine more often than directed. Talk to your pediatrician regarding the use of this medicine in children. While this drug may be prescribed for children as young as 5 years for selected conditions, precautions do apply. Overdosage: If you think you have taken too much of this medicine contact a poison control center or emergency room at once. NOTE: This medicine is only for you. Do not share this medicine with others. What if I miss a dose? If you miss a dose, take it as soon as you can. If it is almost time for your next dose, take only that dose. Do not take double or extra doses. What may interact with this medicine? Do not take this medicine with any of the following medications: -atazanavir -nelfinavir This medicine may also interact with the following medications: -ampicillin -delavirdine -erlotinib -iron salts -medicines for fungal infections like ketoconazole, itraconazole and voriconazole -methotrexate -mycophenolate mofetil -warfarin This list  may not describe all possible interactions. Give your health care provider a list of all the medicines, herbs, non-prescription drugs, or dietary supplements you use. Also tell them if you smoke, drink alcohol, or use illegal drugs. Some items may interact with your medicine. What should I watch for while using this medicine? It can take several days before your stomach pain gets better. Check with your doctor or health care professional if your condition does not start to get better, or if it gets worse. You may need blood work done while you are taking this medicine. What side effects may I notice from receiving this medicine? Side effects that you should report to your doctor or health care professional as soon as possible: -allergic reactions like skin rash, itching or hives, swelling of the face, lips, or tongue -bone, muscle or joint pain -breathing problems -chest pain or chest tightness -dark yellow or brown urine -dizziness -fast, irregular heartbeat -feeling faint or lightheaded -fever or sore throat -muscle spasm -palpitations -redness, blistering, peeling or loosening of the skin, including inside the mouth -seizures -tremors -unusual bleeding or bruising -unusually weak or tired -yellowing of the eyes or skin Side effects that usually do not require medical attention (Report these to your doctor or health care professional if they continue or are bothersome.): -constipation -diarrhea -dry mouth -headache -nausea This list may not describe all possible side effects. Call your doctor for medical advice about side effects. You may report side effects to FDA at 1-800-FDA-1088.  Where should I keep my medicine? Keep out of the reach of children. Store at room temperature between 15 and 30 degrees C (59 and 86 degrees F). Protect from light and moisture. Throw away any unused medicine after the expiration date. NOTE: This sheet is a summary. It may not cover all possible  information. If you have questions about this medicine, talk to your doctor, pharmacist, or health care provider.    2016, Elsevier/Gold Standard. (2014-11-24 14:45:56)  Ondansetron oral dissolving tablet What is this medicine? ONDANSETRON (on DAN se tron) is used to treat nausea and vomiting caused by chemotherapy. It is also used to prevent or treat nausea and vomiting after surgery. This medicine may be used for other purposes; ask your health care provider or pharmacist if you have questions. What should I tell my health care provider before I take this medicine? They need to know if you have any of these conditions: -heart disease -history of irregular heartbeat -liver disease -low levels of magnesium or potassium in the blood -an unusual or allergic reaction to ondansetron, granisetron, other medicines, foods, dyes, or preservatives -pregnant or trying to get pregnant -breast-feeding How should I use this medicine? These tablets are made to dissolve in the mouth. Do not try to push the tablet through the foil backing. With dry hands, peel away the foil backing and gently remove the tablet. Place the tablet in the mouth and allow it to dissolve, then swallow. While you may take these tablets with water, it is not necessary to do so. Talk to your pediatrician regarding the use of this medicine in children. Special care may be needed. Overdosage: If you think you have taken too much of this medicine contact a poison control center or emergency room at once. NOTE: This medicine is only for you. Do not share this medicine with others. What if I miss a dose? If you miss a dose, take it as soon as you can. If it is almost time for your next dose, take only that dose. Do not take double or extra doses. What may interact with this medicine? Do not take this medicine with any of the following medications: -apomorphine -certain medicines for fungal infections like fluconazole, itraconazole,  ketoconazole, posaconazole, voriconazole -cisapride -dofetilide -dronedarone -pimozide -thioridazine -ziprasidone This medicine may also interact with the following medications: -carbamazepine -certain medicines for depression, anxiety, or psychotic disturbances -fentanyl -linezolid -MAOIs like Carbex, Eldepryl, Marplan, Nardil, and Parnate -methylene blue (injected into a vein) -other medicines that prolong the QT interval (cause an abnormal heart rhythm) -phenytoin -rifampicin -tramadol This list may not describe all possible interactions. Give your health care provider a list of all the medicines, herbs, non-prescription drugs, or dietary supplements you use. Also tell them if you smoke, drink alcohol, or use illegal drugs. Some items may interact with your medicine. What should I watch for while using this medicine? Check with your doctor or health care professional as soon as you can if you have any sign of an allergic reaction. What side effects may I notice from receiving this medicine? Side effects that you should report to your doctor or health care professional as soon as possible: -allergic reactions like skin rash, itching or hives, swelling of the face, lips, or tongue -breathing problems -confusion -dizziness -fast or irregular heartbeat -feeling faint or lightheaded, falls -fever and chills -loss of balance or coordination -seizures -sweating -swelling of the hands and feet -tightness in the chest -tremors -unusually weak or tired Side effects  that usually do not require medical attention (report to your doctor or health care professional if they continue or are bothersome): -constipation or diarrhea -headache This list may not describe all possible side effects. Call your doctor for medical advice about side effects. You may report side effects to FDA at 1-800-FDA-1088. Where should I keep my medicine? Keep out of the reach of children. Store between 2 and  30 degrees C (36 and 86 degrees F). Throw away any unused medicine after the expiration date. NOTE: This sheet is a summary. It may not cover all possible information. If you have questions about this medicine, talk to your doctor, pharmacist, or health care provider.    2016, Elsevier/Gold Standard. (2013-07-13 16:21:52)

## 2016-04-14 NOTE — ED Provider Notes (Signed)
CSN: [id number]     Arrival date & time 04/14/16  0139 History   First MD Initiated Contact with Patient 04/14/16 0355     Chief Complaint  Patient presents with  . Emesis     (Consider location/radiation/quality/duration/timing/severity/associated sxs/prior Treatment) Patient is a 82 y.o. female presenting with vomiting. The history is provided by the EMS personnel and a relative. The history is limited by the condition of the patient (Dementia).  Emesis She apparently had been vomiting at the nursing home about 10 days ago. Ultrasound of gallbladder was obtained which was reported to have been negative. She improved somewhat until about 36 hours ago when she's resumed vomiting. She has had several falls at the nursing home. She is not able to give any history whatsoever.  Past Medical History  Diagnosis Date  . Arthritis   . Hypercholesteremia   . Hypertension   . Anxiety   . CKD (chronic kidney disease) stage 3, GFR 30-59 ml/min 01/15/2015  . Cellulitis of left leg 01/14/2015  . Diastolic dysfunction 01/17/2015    EF 60-65%. Grade 1 diastolic dysfunction.  . Dementia   . CHF (congestive heart failure) (HCC)   . Anemia   . Hypokalemia   . Psychoses   . Major depressive disorder (HCC)   . Muscle weakness   . Cardiac murmur   . Dysphagia   . Pulmonary edema   . Hypokalemia    Past Surgical History  Procedure Laterality Date  . Kidney cyst removal     Family History  Problem Relation Age of Onset  . Diabetes Father    Social History  Substance Use Topics  . Smoking status: Never Smoker   . Smokeless tobacco: None  . Alcohol Use: No   OB History    No data available     Review of Systems  Unable to perform ROS: Dementia  Gastrointestinal: Positive for vomiting.      Allergies  Macrobid and Meloxicam  Home Medications   Prior to Admission medications   Medication Sig Start Date End Date Taking? Authorizing Provider  carbidopa-levodopa (SINEMET IR) 25-100  MG per tablet Take 1/2 pill twice daily x 1 week, then 1/2 pill 3 times a day thereafter. 02/13/15   Huston Foley, MD  cetirizine (ZYRTEC) 10 MG tablet Take 1 tablet (10 mg total) by mouth daily. 02/08/14   Deatra Canter, FNP  citalopram (CELEXA) 20 MG tablet Take 20 mg by mouth daily. anxiety    Historical Provider, MD  furosemide (LASIX) 20 MG tablet Take 20 mg by mouth.    Historical Provider, MD  naproxen (NAPROSYN) 375 MG tablet  04/27/15   Historical Provider, MD  potassium chloride (K-DUR,KLOR-CON) 10 MEQ tablet Take 1 tablet (10 mEq total) by mouth daily. 01/17/15   Elliot Cousin, MD  risperiDONE (RISPERDAL) 0.5 MG tablet  05/13/15   Historical Provider, MD   BP 163/79 mmHg  Pulse 91  Temp(Src) 98.4 F (36.9 C) (Oral)  Resp 20  Ht 5\' 1"  (1.549 m)  Wt 175 lb (79.379 kg)  BMI 33.08 kg/m2  SpO2 97% Physical Exam  Nursing note and vitals reviewed.  82 year old female, resting comfortably and in no acute distress. Vital signs are significant for hypertension. Oxygen saturation is 97%, which is normal. Head is normocephalic. Ecchymosis is present on the right malar area and right frontal area. PERRLA, EOMI. Oropharynx is clear. Neck is nontender without adenopathy or JVD. Back is nontender and there is no CVA tenderness.  Lungs are clear without rales, wheezes, or rhonchi. Chest is nontender. Heart has regular rate and rhythm without murmur. Abdomen is soft, flat, nontender without masses or hepatosplenomegaly and peristalsis is normoactive. Extremities have no cyanosis or edema, full range of motion is present. Skin is warm and dry without rash. Neurologic: She is awake and alert but nonverbal and does not follow commands, cranial nerves are intact, there are no motor or sensory deficits, tremor of Parkinson's disease is present.  ED Course  Procedures (including critical care time) Labs Review Results for orders placed or performed during the hospital encounter of 04/14/16   Comprehensive metabolic panel  Result Value Ref Range   Sodium 139 135 - 145 mmol/L   Potassium 4.0 3.5 - 5.1 mmol/L   Chloride 102 101 - 111 mmol/L   CO2 27 22 - 32 mmol/L   Glucose, Bld 170 (H) 65 - 99 mg/dL   BUN 19 6 - 20 mg/dL   Creatinine, Ser 9.56 0.44 - 1.00 mg/dL   Calcium 9.5 8.9 - 21.3 mg/dL   Total Protein 7.1 6.5 - 8.1 g/dL   Albumin 4.1 3.5 - 5.0 g/dL   AST 24 15 - 41 U/L   ALT 20 14 - 54 U/L   Alkaline Phosphatase 69 38 - 126 U/L   Total Bilirubin 1.4 (H) 0.3 - 1.2 mg/dL   GFR calc non Af Amer >60 >60 mL/min   GFR calc Af Amer >60 >60 mL/min   Anion gap 10 5 - 15  Lipase, blood  Result Value Ref Range   Lipase 10 (L) 11 - 51 U/L  CBC with Differential  Result Value Ref Range   WBC 11.6 (H) 4.0 - 10.5 K/uL   RBC 4.59 3.87 - 5.11 MIL/uL   Hemoglobin 13.7 12.0 - 15.0 g/dL   HCT 08.6 57.8 - 46.9 %   MCV 91.9 78.0 - 100.0 fL   MCH 29.8 26.0 - 34.0 pg   MCHC 32.5 30.0 - 36.0 g/dL   RDW 62.9 52.8 - 41.3 %   Platelets 208 150 - 400 K/uL   Neutrophils Relative % 91 %   Neutro Abs 10.5 (H) 1.7 - 7.7 K/uL   Lymphocytes Relative 6 %   Lymphs Abs 0.7 0.7 - 4.0 K/uL   Monocytes Relative 3 %   Monocytes Absolute 0.4 0.1 - 1.0 K/uL   Eosinophils Relative 0 %   Eosinophils Absolute 0.0 0.0 - 0.7 K/uL   Basophils Relative 0 %   Basophils Absolute 0.0 0.0 - 0.1 K/uL  Lactic acid, plasma  Result Value Ref Range   Lactic Acid, Venous 1.1 0.5 - 2.0 mmol/L  I-Stat CG4 Lactic Acid, ED  Result Value Ref Range   Lactic Acid, Venous 1.26 0.5 - 2.0 mmol/L  Type and screen [facility]  Result Value Ref Range   ABO/RH(D) A NEG    Antibody Screen NEG    Sample Expiration 04/17/2016     Imaging Review Ct Head Wo Contrast  04/14/2016  CLINICAL DATA:  Possible unwitnessed fall. Bruising to the right side of her head. EXAM: CT HEAD WITHOUT CONTRAST CT CERVICAL SPINE WITHOUT CONTRAST TECHNIQUE: Multidetector CT imaging of the head and cervical spine was performed  following the standard protocol without intravenous contrast. Multiplanar CT image reconstructions of the cervical spine were also generated. COMPARISON:  01/14/2015 FINDINGS: CT HEAD FINDINGS There is no intracranial hemorrhage, mass or evidence of acute infarction. There is moderate generalized atrophy. There is moderately severe  chronic microvascular ischemic change. There is no significant extra-axial fluid collection. No acute intracranial findings are evident. The calvarium and skullbase are intact. The visible paranasal sinuses and orbits are intact. CT CERVICAL SPINE FINDINGS The vertebral column, pedicles and facet articulations are intact. There is no evidence of acute fracture. No acute soft tissue abnormalities are evident. Moderate degenerative cervical disc disease is present from C4 through C7. IMPRESSION: 1. Negative for acute intracranial traumatic injury. There is moderately severe chronic white matter hypodensity and moderate generalized atrophy. 2. Negative for acute cervical spine fracture Electronically Signed   By: Ellery Plunk M.D.   On: 04/14/2016 06:46   Ct Cervical Spine Wo Contrast  04/14/2016  CLINICAL DATA:  Possible unwitnessed fall. Bruising to the right side of her head. EXAM: CT HEAD WITHOUT CONTRAST CT CERVICAL SPINE WITHOUT CONTRAST TECHNIQUE: Multidetector CT imaging of the head and cervical spine was performed following the standard protocol without intravenous contrast. Multiplanar CT image reconstructions of the cervical spine were also generated. COMPARISON:  01/14/2015 FINDINGS: CT HEAD FINDINGS There is no intracranial hemorrhage, mass or evidence of acute infarction. There is moderate generalized atrophy. There is moderately severe chronic microvascular ischemic change. There is no significant extra-axial fluid collection. No acute intracranial findings are evident. The calvarium and skullbase are intact. The visible paranasal sinuses and orbits are intact. CT  CERVICAL SPINE FINDINGS The vertebral column, pedicles and facet articulations are intact. There is no evidence of acute fracture. No acute soft tissue abnormalities are evident. Moderate degenerative cervical disc disease is present from C4 through C7. IMPRESSION: 1. Negative for acute intracranial traumatic injury. There is moderately severe chronic white matter hypodensity and moderate generalized atrophy. 2. Negative for acute cervical spine fracture Electronically Signed   By: Ellery Plunk M.D.   On: 04/14/2016 06:46   I have personally reviewed and evaluated these images and lab results as part of my medical decision-making.    MDM   Final diagnoses:  Non-intractable vomiting with nausea, unspecified vomiting type  Fall at nursing home, initial encounter    Nausea and vomiting. History of multiple falls. We'll send for CT of head and cervical spine. IV fluids are ordered as well as ondansetron for nausea. Screening labs are obtained.  Following above-noted treatment, patient is resting comfortably and has had no further vomiting. CT scan showed no evidence of intracranial or cervical spine injury. Laboratory workup is unremarkable. Patient's family is inquiring about gallbladder issues and they're advised that with benign abdominal exam and normal labs, did not see an indication for repeat gallbladder ultrasound or CT scan. She is discharged to go back to her rehabilitation center. Prescriptions are given for ondansetron oral dissolving tablet. She also is complaining of some burning in her esophagus, so she is given a prescription for pantoprazole. Return should symptoms worsen.  Dione Booze, MD 04/14/16 [phone]

## 2016-04-14 NOTE — ED Notes (Signed)
Melissa with [facility] called to receive information about pt; this RN spoke with Melissa and told her what [facility] reported and informed her of bruising to pt

## 2016-04-14 NOTE — ED Notes (Signed)
Pt brought in by [facility] for c/o vomiting; [facility] reports that the staff at the rehab center found prone in the bed with her head between the dresser and bed; pt has green-yellow bruising to right side of head; pt has multiple bruising to bilateral arms; [facility] states pt had coffee ground emesis on her gown; pt has dementia and unable to give any history

## 2016-04-14 NOTE — ED Notes (Signed)
Patient vomited a large amount of dark brown emesis.  Gown changed.  Daughter at bedside.

## 2016-05-14 ENCOUNTER — Encounter (HOSPITAL_COMMUNITY): Payer: Self-pay | Admitting: Emergency Medicine

## 2016-05-14 ENCOUNTER — Emergency Department (HOSPITAL_COMMUNITY)
Admission: EM | Admit: 2016-05-14 | Discharge: 2016-05-14 | Disposition: A | Discharge status: Home / Self Care | Payer: Medicare Other | Attending: Emergency Medicine | Admitting: Emergency Medicine

## 2016-05-14 DIAGNOSIS — I132 Hypertensive heart and chronic kidney disease with heart failure and with stage 5 chronic kidney disease, or end stage renal disease: Secondary | ICD-10-CM | POA: Insufficient documentation

## 2016-05-14 DIAGNOSIS — F329 Major depressive disorder, single episode, unspecified: Secondary | ICD-10-CM | POA: Insufficient documentation

## 2016-05-14 DIAGNOSIS — I509 Heart failure, unspecified: Secondary | ICD-10-CM | POA: Insufficient documentation

## 2016-05-14 DIAGNOSIS — Z7982 Long term (current) use of aspirin: Secondary | ICD-10-CM | POA: Insufficient documentation

## 2016-05-14 DIAGNOSIS — R112 Nausea with vomiting, unspecified: Secondary | ICD-10-CM | POA: Insufficient documentation

## 2016-05-14 DIAGNOSIS — N185 Chronic kidney disease, stage 5: Secondary | ICD-10-CM | POA: Diagnosis not present

## 2016-05-14 DIAGNOSIS — Z79899 Other long term (current) drug therapy: Secondary | ICD-10-CM | POA: Insufficient documentation

## 2016-05-14 LAB — COMPREHENSIVE METABOLIC PANEL
ALBUMIN: 3.5 g/dL (ref 3.5–5.0)
ALT: 5 U/L — ABNORMAL LOW (ref 14–54)
ANION GAP: 4 — AB (ref 5–15)
AST: 15 U/L (ref 15–41)
Alkaline Phosphatase: 66 U/L (ref 38–126)
BILIRUBIN TOTAL: 0.6 mg/dL (ref 0.3–1.2)
BUN: 16 mg/dL (ref 6–20)
CHLORIDE: 105 mmol/L (ref 101–111)
CO2: 27 mmol/L (ref 22–32)
Calcium: 8.5 mg/dL — ABNORMAL LOW (ref 8.9–10.3)
Creatinine, Ser: 0.93 mg/dL (ref 0.44–1.00)
GFR calc Af Amer: >60 mL/min (ref >60)
GFR, EST NON AFRICAN AMERICAN: 55 mL/min — AB (ref >60)
GLUCOSE: 123 mg/dL — AB (ref 65–99)
POTASSIUM: 3.5 mmol/L (ref 3.5–5.1)
Sodium: 136 mmol/L (ref 135–145)
TOTAL PROTEIN: 6.3 g/dL — AB (ref 6.5–8.1)

## 2016-05-14 LAB — CBC WITH DIFFERENTIAL/PLATELET
BASOS PCT: 0 %
Basophils Absolute: 0 10*3/uL (ref 0.0–0.1)
EOS PCT: 0 %
Eosinophils Absolute: 0 10*3/uL (ref 0.0–0.7)
HEMATOCRIT: 39.1 % (ref 36.0–46.0)
Hemoglobin: 12.7 g/dL (ref 12.0–15.0)
Lymphocytes Relative: 20 %
Lymphs Abs: 0.9 10*3/uL (ref 0.7–4.0)
MCH: 30 pg (ref 26.0–34.0)
MCHC: 32.5 g/dL (ref 30.0–36.0)
MCV: 92.4 fL (ref 78.0–100.0)
MONO ABS: 0.1 10*3/uL (ref 0.1–1.0)
MONOS PCT: 3 %
NEUTROS ABS: 3.7 10*3/uL (ref 1.7–7.7)
Neutrophils Relative %: 77 %
PLATELETS: 199 10*3/uL (ref 150–400)
RBC: 4.23 MIL/uL (ref 3.87–5.11)
RDW: 14 % (ref 11.5–15.5)
WBC: 4.8 10*3/uL (ref 4.0–10.5)

## 2016-05-14 LAB — URINALYSIS, ROUTINE W REFLEX MICROSCOPIC
Bilirubin Urine: NEGATIVE
Glucose, UA: NEGATIVE mg/dL
Hgb urine dipstick: NEGATIVE
KETONES UR: NEGATIVE mg/dL
LEUKOCYTES UA: NEGATIVE
Nitrite: NEGATIVE
Protein, ur: NEGATIVE mg/dL
Specific Gravity, Urine: 1.02 (ref 1.005–1.030)
pH: 6 (ref 5.0–8.0)

## 2016-05-14 LAB — LIPASE, BLOOD: Lipase: 11 U/L (ref 11–51)

## 2016-05-14 MED ORDER — ONDANSETRON 4 MG PO TBDP: 4.0000 mg | ORAL_TABLET | Freq: Three times a day (TID) | ORAL | 0 refills | Status: DC | PRN

## 2016-05-14 MED ORDER — ONDANSETRON HCL 4 MG/2ML IJ SOLN
4.0000 mg | Freq: Once | INTRAMUSCULAR | Status: AC
  Administered 2016-05-14: 4 mg via INTRAVENOUS
  Filled 2016-05-14: qty 2

## 2016-05-14 NOTE — ED Provider Notes (Signed)
[facility]-EMERGENCY DEPT Provider Note   CSN: [id number] Arrival date & time: 05/14/16  1553  First Provider Contact:   4:20 PM    History   Chief Complaint Chief Complaint  Patient presents with  . Emesis    HPI Sherry Fox is a 82 y.o. female. She presents via EMS from her nursing facility and [facility], [place].  She has dementia and is unable to offer any details of her history. She is immediately accompanied by her daughter. Daughter states that when she went there this morning that her mother had emesis on her nightgown. Fairly had an additional episode of emesis about an hour ago and has not complained of anything. Daughter was concerned because apparently "last time" at this happened she was brought to the emergency room and was "dehydrated". Daughter is also concerned about her gallbladder stating that she had a portable ultrasound was told that there was sludge. No fever no recent falls or injuries no new medications no additional complaints  HPI  Past Medical History:  Diagnosis Date  . Anemia   . Anxiety   . Arthritis   . Cardiac murmur   . Cellulitis of left leg 01/14/2015  . CHF (congestive heart failure) (HCC)   . CKD (chronic kidney disease) stage 3, GFR 30-59 ml/min 01/15/2015  . Dementia   . Diastolic dysfunction 01/17/2015   EF 60-65%. Grade 1 diastolic dysfunction.  Marland Kitchen Dysphagia   . Hypercholesteremia   . Hypertension   . Hypokalemia   . Hypokalemia   . Major depressive disorder (HCC)   . Muscle weakness   . Psychoses   . Pulmonary edema     Patient Active Problem List   Diagnosis Date Noted  . Diastolic dysfunction 01/17/2015  . CKD (chronic kidney disease) stage 3, GFR 30-59 ml/min 01/15/2015  . Rash and nonspecific skin eruption 01/15/2015  . Hypotension 01/14/2015  . Sepsis (HCC) 01/14/2015  . Cellulitis of left leg 01/14/2015  . Dementia 01/14/2015  . Encephalopathy 01/14/2015  . Acute renal failure (HCC) 01/14/2015  .  Hallucination 03/03/2014  . Altered mental status 03/02/2014  . Hypnagogic hallucinations 03/02/2014  . Depression 03/02/2014  . Essential hypertension, benign 04/11/2013  . Morbid obesity (HCC) 04/11/2013  . Other and unspecified hyperlipidemia 04/11/2013  . Diverticulosis of colon without hemorrhage 04/11/2013  . DJD (degenerative joint disease) 04/11/2013    Past Surgical History:  Procedure Laterality Date  . KIDNEY CYST REMOVAL      OB History    No data available       Home Medications    Prior to Admission medications   Medication Sig Start Date End Date Taking? Authorizing Provider  acetaminophen (TYLENOL) 500 MG tablet Take 500 mg by mouth every 8 (eight) hours as needed (osteoarthritis).   Yes Historical Provider, MD  acetaminophen (TYLENOL) 650 MG suppository Place 650 mg rectally every 4 (four) hours as needed for moderate pain.   Yes Historical Provider, MD  aspirin EC 81 MG tablet Take 81 mg by mouth daily.   Yes Historical Provider, MD  carbidopa-levodopa (SINEMET IR) 25-100 MG per tablet Take 1/2 pill twice daily x 1 week, then 1/2 pill 3 times a day thereafter. Patient taking differently: Take 0.5 tablets by mouth 3 (three) times daily. Take 1/2 pill twice daily x 1 week, then 1/2 pill 3 times a day thereafter. 02/13/15  Yes Huston Foley, MD  cetirizine (ZYRTEC) 10 MG tablet Take 1 tablet (10 mg total) by mouth  daily. 02/08/14  Yes Deatra Canter, FNP  citalopram (CELEXA) 10 MG tablet Take 10 mg by mouth daily.   Yes Historical Provider, MD  feeding supplement (BOOST / RESOURCE BREEZE) LIQD Take 1 Container by mouth 3 (three) times daily between meals.   Yes Historical Provider, MD  furosemide (LASIX) 20 MG tablet Take 20 mg by mouth.   Yes Historical Provider, MD  oxyCODONE (OXY IR/ROXICODONE) 5 MG immediate release tablet Take 5 mg by mouth every 12 (twelve) hours as needed for severe pain (pain level 7-10).   Yes Historical Provider, MD  pantoprazole (PROTONIX)  20 MG tablet Take 20 mg by mouth daily.   Yes Historical Provider, MD  polyethylene glycol (MIRALAX / GLYCOLAX) packet Take 17 g by mouth daily.   Yes Historical Provider, MD  potassium chloride (K-DUR,KLOR-CON) 10 MEQ tablet Take 1 tablet (10 mEq total) by mouth daily. 01/17/15  Yes Elliot Cousin, MD  risperiDONE (RISPERDAL) 0.5 MG tablet Take 0.5 mg by mouth at bedtime.  05/13/15  Yes Historical Provider, MD  Vitamin D, Ergocalciferol, (DRISDOL) 50000 units CAPS capsule Take 50,000 Units by mouth every 30 (thirty) days.   Yes Historical Provider, MD  ondansetron (ZOFRAN ODT) 4 MG disintegrating tablet Take 1 tablet (4 mg total) by mouth every 8 (eight) hours as needed for nausea. 05/14/16   Rolland Porter, MD  pantoprazole (PROTONIX) 40 MG tablet Take 1 tablet (40 mg total) by mouth daily. Patient not taking: Reported on 05/14/2016 04/14/16   Dione Booze, MD    Family History Family History  Problem Relation Age of Onset  . Diabetes Father     Social History Social History  Substance Use Topics  . Smoking status: Never Smoker  . Smokeless tobacco: Never Used  . Alcohol use No     Allergies   Macrobid [nitrofurantoin monohyd macro] and Meloxicam   Review of Systems Review of Systems  Unable to perform ROS: Dementia  GI positive for nausea. Denies abdominal pain to daughter in my presence.   Physical Exam Updated Vital Signs BP 118/60   Pulse 72   Temp 97.4 F (36.3 C) (Oral)   Resp 19   Ht  (1.549 m)   Wt 175 lb (79.4 kg)   SpO2 95%   BMI 33.07 kg/m   Physical Exam  Constitutional: She appears well-developed and well-nourished. No distress.  Speaks 1-2 words at a time. Difficult to understand. Daughter is able to understand a majority of the conversation. Ultimately level V for dementia  HENT:  Head: Normocephalic.  Eyes: Conjunctivae are normal. Pupils are equal, round, and reactive to light. No scleral icterus.  Neck: Normal range of motion. Neck supple. No  thyromegaly present.  Cardiovascular: Normal rate and regular rhythm.  Exam reveals no gallop and no friction rub.   No murmur heard. Pulmonary/Chest: Effort normal and breath sounds normal. No respiratory distress. She has no wheezes. She has no rales.  Abdominal: Soft. Bowel sounds are normal. She exhibits no distension. There is no tenderness. There is no rebound.  Abdomen is soft. Normal active bowel sounds. Does not react as if she is painful to aggressive palpation to the entire abdomen.  Musculoskeletal: Normal range of motion.  Neurological: She is alert.  Skin: Skin is warm and dry. No rash noted.  Psychiatric: She has a normal mood and affect. Her behavior is normal.     ED Treatments / Results  Labs (all labs ordered are listed, but only abnormal results  are displayed) Labs Reviewed  COMPREHENSIVE METABOLIC PANEL - Abnormal; Notable for the following:       Result Value   Glucose, Bld 123 (*)    Calcium 8.5 (*)    Total Protein 6.3 (*)    ALT 5 (*)    GFR calc non Af Amer 55 (*)    Anion gap 4 (*)    All other components within normal limits  CBC WITH DIFFERENTIAL/PLATELET  URINALYSIS, ROUTINE W REFLEX MICROSCOPIC (NOT AT [facility])  LIPASE, BLOOD    EKG  EKG Interpretation None       Radiology No results found.  Procedures Procedures (including critical care time)  Medications Ordered in ED Medications  ondansetron (ZOFRAN) injection 4 mg (4 mg Intravenous Given 05/14/16 1629)     Initial Impression / Assessment and Plan / ED Course  I have reviewed the triage vital signs and the nursing notes.  Pertinent labs & imaging results that were available during my care of the patient were reviewed by me and considered in my medical decision making (see chart for details).  Clinical Course   She remained a symptomatically. Taking by mouth liquids. Appropriate for discharge home.  Final Clinical Impressions(s) / ED Diagnoses   Final diagnoses:    Non-intractable vomiting with nausea, vomiting of unspecified type    New Prescriptions New Prescriptions   ONDANSETRON (ZOFRAN ODT) 4 MG DISINTEGRATING TABLET    Take 1 tablet (4 mg total) by mouth every 8 (eight) hours as needed for nausea.     Rolland Porter, MD 05/14/16 Izell Armington

## 2016-05-14 NOTE — ED Triage Notes (Signed)
Per EMS, pt from [facility]. Facility reporting vomiting began early this morning. Pt denies any abdominal pain. Pt given 500cc NS PTA. No vomiting during EMS transport.

## 2016-05-14 NOTE — ED Notes (Signed)
Pt and family informed that urine specimen is needed. Family states that pt is normally able to use the bathroom and wants to try a bedpan before catheterization. Pt reports unable to urinate at this time. States they will notify staff when a specimen can be obtained.

## 2016-05-14 NOTE — ED Notes (Signed)
Pt placed on bedpan

## 2016-05-14 NOTE — ED Notes (Addendum)
Called [facility] to make them aware that the pt is on her way back. Notified nurse that pt has several pressure ulcers on her bottom. Spoke with Rodell Perna who stated she was this pt's Charity fundraiser.

## 2016-05-14 NOTE — ED Notes (Signed)
[name] arrived and is at bedside.  

## 2016-05-14 NOTE — ED Notes (Signed)
Patient will call when she  is able to use bathroom.

## 2016-08-03 ENCOUNTER — Encounter (HOSPITAL_COMMUNITY): Payer: Self-pay | Admitting: Emergency Medicine

## 2016-08-03 ENCOUNTER — Emergency Department (HOSPITAL_COMMUNITY): Payer: Medicare Other

## 2016-08-03 ENCOUNTER — Emergency Department (HOSPITAL_COMMUNITY)
Admission: EM | Admit: 2016-08-03 | Discharge: 2016-08-03 | Disposition: A | Discharge status: Home / Self Care | Payer: Medicare Other | Attending: Emergency Medicine | Admitting: Emergency Medicine

## 2016-08-03 DIAGNOSIS — I5030 Unspecified diastolic (congestive) heart failure: Secondary | ICD-10-CM | POA: Insufficient documentation

## 2016-08-03 DIAGNOSIS — Y9289 Other specified places as the place of occurrence of the external cause: Secondary | ICD-10-CM | POA: Insufficient documentation

## 2016-08-03 DIAGNOSIS — Y939 Activity, unspecified: Secondary | ICD-10-CM | POA: Diagnosis not present

## 2016-08-03 DIAGNOSIS — I130 Hypertensive heart and chronic kidney disease with heart failure and stage 1 through stage 4 chronic kidney disease, or unspecified chronic kidney disease: Secondary | ICD-10-CM | POA: Diagnosis not present

## 2016-08-03 DIAGNOSIS — N183 Chronic kidney disease, stage 3 (moderate): Secondary | ICD-10-CM | POA: Diagnosis not present

## 2016-08-03 DIAGNOSIS — S51811A Laceration without foreign body of right forearm, initial encounter: Secondary | ICD-10-CM | POA: Diagnosis not present

## 2016-08-03 DIAGNOSIS — S0191XA Laceration without foreign body of unspecified part of head, initial encounter: Secondary | ICD-10-CM

## 2016-08-03 DIAGNOSIS — S0181XA Laceration without foreign body of other part of head, initial encounter: Secondary | ICD-10-CM | POA: Insufficient documentation

## 2016-08-03 DIAGNOSIS — S8002XA Contusion of left knee, initial encounter: Secondary | ICD-10-CM | POA: Insufficient documentation

## 2016-08-03 DIAGNOSIS — F0390 Unspecified dementia without behavioral disturbance: Secondary | ICD-10-CM | POA: Diagnosis not present

## 2016-08-03 DIAGNOSIS — Z7982 Long term (current) use of aspirin: Secondary | ICD-10-CM | POA: Diagnosis not present

## 2016-08-03 DIAGNOSIS — Y999 Unspecified external cause status: Secondary | ICD-10-CM | POA: Insufficient documentation

## 2016-08-03 DIAGNOSIS — Z79899 Other long term (current) drug therapy: Secondary | ICD-10-CM | POA: Insufficient documentation

## 2016-08-03 DIAGNOSIS — W19XXXA Unspecified fall, initial encounter: Secondary | ICD-10-CM | POA: Diagnosis not present

## 2016-08-03 LAB — URINALYSIS, ROUTINE W REFLEX MICROSCOPIC
Bilirubin Urine: NEGATIVE
GLUCOSE, UA: NEGATIVE mg/dL
Hgb urine dipstick: NEGATIVE
KETONES UR: NEGATIVE mg/dL
LEUKOCYTES UA: NEGATIVE
Nitrite: NEGATIVE
PH: 7 (ref 5.0–8.0)
Protein, ur: NEGATIVE mg/dL
SPECIFIC GRAVITY, URINE: 1.015 (ref 1.005–1.030)

## 2016-08-03 LAB — I-STAT CHEM 8, ED
BUN: 18 mg/dL (ref 6–20)
CALCIUM ION: 1.2 mmol/L (ref 1.15–1.40)
CHLORIDE: 103 mmol/L (ref 101–111)
CREATININE: 0.9 mg/dL (ref 0.44–1.00)
Glucose, Bld: 113 mg/dL — ABNORMAL HIGH (ref 65–99)
HEMATOCRIT: 37 % (ref 36.0–46.0)
Hemoglobin: 12.6 g/dL (ref 12.0–15.0)
Potassium: 3.7 mmol/L (ref 3.5–5.1)
Sodium: 141 mmol/L (ref 135–145)
TCO2: 27 mmol/L (ref 0–100)

## 2016-08-03 NOTE — ED Notes (Signed)
Dr. Rosalia Hammers notified of patient having left hand pain and contusion.  Dr. Rosalia Hammers at bedside to re-evaluate patient.

## 2016-08-03 NOTE — ED Provider Notes (Signed)
[facility]-EMERGENCY DEPT Provider Note   CSN: [id number] Arrival date & time: 08/03/16  1644   Level V caveat secondary to dementia  History   Chief Complaint Chief Complaint  Patient presents with  . Fall    HPI Sherry Fox is a 82 y.o. female.  HPI  [name] who is brought in by her daughter. Daughter is providing history. She lives at a nursing home. She had an unwitnessed fall today. She has had multiple mechanical falls in the past. She was found on the ground and had a forehead laceration, right forearm skin tear, and contusion to the left knee. She is transferred here for further evaluation. Patient does not have any complaints but does knowledge if there are areas tender when she is examined.  Past Medical History:  Diagnosis Date  . Anemia   . Anxiety   . Arthritis   . Cardiac murmur   . Cellulitis of left leg 01/14/2015  . CHF (congestive heart failure) (HCC)   . CKD (chronic kidney disease) stage 3, GFR 30-59 ml/min 01/15/2015  . Dementia   . Diastolic dysfunction 01/17/2015   EF 60-65%. Grade 1 diastolic dysfunction.  Marland Kitchen Dysphagia   . Hypercholesteremia   . Hypertension   . Hypokalemia   . Hypokalemia   . Major depressive disorder   . Muscle weakness   . Psychoses   . Pulmonary edema     Patient Active Problem List   Diagnosis Date Noted  . Diastolic dysfunction 01/17/2015  . CKD (chronic kidney disease) stage 3, GFR 30-59 ml/min 01/15/2015  . Rash and nonspecific skin eruption 01/15/2015  . Hypotension 01/14/2015  . Sepsis (HCC) 01/14/2015  . Cellulitis of left leg 01/14/2015  . Dementia 01/14/2015  . Encephalopathy 01/14/2015  . Acute renal failure (HCC) 01/14/2015  . Hallucination 03/03/2014  . Altered mental status 03/02/2014  . Hypnagogic hallucinations 03/02/2014  . Depression 03/02/2014  . Essential hypertension, benign 04/11/2013  . Morbid obesity (HCC) 04/11/2013  . Other and unspecified hyperlipidemia 04/11/2013  .  Diverticulosis of colon without hemorrhage 04/11/2013  . DJD (degenerative joint disease) 04/11/2013    Past Surgical History:  Procedure Laterality Date  . KIDNEY CYST REMOVAL      OB History    No data available       Home Medications    Prior to Admission medications   Medication Sig Start Date End Date Taking? Authorizing Provider  acetaminophen (TYLENOL) 500 MG tablet Take 500 mg by mouth every 8 (eight) hours.    Yes Historical Provider, MD  aspirin EC 81 MG tablet Take 81 mg by mouth daily.   Yes Historical Provider, MD  carbidopa-levodopa (SINEMET IR) 25-100 MG per tablet Take 1/2 pill twice daily x 1 week, then 1/2 pill 3 times a day thereafter. Patient taking differently: Take 0.5 tablets by mouth 3 (three) times daily. Take 1/2 pill twice daily x 1 week, then 1/2 pill 3 times a day thereafter. 02/13/15  Yes Huston Foley, MD  citalopram (CELEXA) 10 MG tablet Take 10 mg by mouth daily.   Yes Historical Provider, MD  feeding supplement (BOOST / RESOURCE BREEZE) LIQD Take 1 Container by mouth 3 (three) times daily between meals.   Yes Historical Provider, MD  furosemide (LASIX) 20 MG tablet Take 20 mg by mouth daily.    Yes Historical Provider, MD  pantoprazole (PROTONIX) 20 MG tablet Take 20 mg by mouth daily.   Yes Historical Provider, MD  potassium  chloride (K-DUR,KLOR-CON) 10 MEQ tablet Take 1 tablet (10 mEq total) by mouth daily. 01/17/15  Yes Elliot Cousinenise Fisher, MD  risperiDONE (RISPERDAL) 0.5 MG tablet Take 0.5 mg by mouth at bedtime.  05/13/15  Yes Historical Provider, MD  senna-docusate (SENNA S) 8.6-50 MG tablet Take 2 tablets by mouth 2 (two) times daily.   Yes Historical Provider, MD  Vitamin D, Ergocalciferol, (DRISDOL) 50000 units CAPS capsule Take 50,000 Units by mouth every 30 (thirty) days.   Yes Historical Provider, MD    Family History Family History  Problem Relation Age of Onset  . Diabetes Father     Social History Social History  Substance Use Topics  .  Smoking status: Never Smoker  . Smokeless tobacco: Never Used  . Alcohol use No     Allergies   Macrobid [nitrofurantoin monohyd macro] and Meloxicam   Review of Systems Review of Systems  Unable to perform ROS: Dementia     Physical Exam Updated Vital Signs BP 136/67 (BP Location: Left Arm)   Pulse 72   Temp 97.3 F (36.3 C) (Oral)   Resp 18   Ht 5\' 4"  (1.626 m)   Wt 79.4 kg   SpO2 95%   BMI 30.04 kg/m   Physical Exam  Constitutional: She appears well-developed and well-nourished. No distress.  HENT:  Head: Normocephalic.  Right Ear: External ear normal.  Left Ear: External ear normal.  Nose: Nose normal.  Mouth/Throat: Oropharynx is clear and moist.  3 cm vertical wound forehead that has cleaned and Steri-Stripped prior to coming to the ED  Eyes: Conjunctivae and EOM are normal. Pupils are equal, round, and reactive to light.  Neck: Normal range of motion. Neck supple.  Cardiovascular: Normal rate.   Pulmonary/Chest: Effort normal.  Abdominal: Soft.  Musculoskeletal:  Dressing in place right forearm Contusion with some tenderness left knee Pelvis stable but complains of some pain with lateral compression Full active range of motion of bilateral hips  Nursing note and vitals reviewed.    ED Treatments / Results  Labs (all labs ordered are listed, but only abnormal results are displayed) Labs Reviewed  I-STAT CHEM 8, ED - Abnormal; Notable for the following:       Result Value   Glucose, Bld 113 (*)    All other components within normal limits  URINALYSIS, ROUTINE W REFLEX MICROSCOPIC (NOT AT [facility])    EKG  EKG Interpretation  Date/Time:  Sunday August 03 2016 17:52:24 EDT Ventricular Rate:  74 PR Interval:    QRS Duration: 84 QT Interval:  382 QTC Calculation: 424 R Axis:   -3 Text Interpretation:  Sinus rhythm Low voltage, precordial leads Confirmed by Cadie Sorci MD, Duwayne HeckANIELLE [id number]) on 08/03/2016 8:26:28 PM       Radiology Dg Pelvis 1-2  Views  Result Date: 08/03/2016 CLINICAL DATA:  Pelvic pain following fall.  Initial encounter. EXAM: PELVIS - 1-2 VIEW COMPARISON:  None. FINDINGS: There is no evidence of pelvic fracture or diastasis. No pelvic bone lesions are seen. IMPRESSION: Negative. Electronically Signed   By: Harmon PierJeffrey  Hu M.D.   On: 08/03/2016 18:43   Ct Head Wo Contrast  Result Date: 08/03/2016 CLINICAL DATA:  Fall.  Head laceration EXAM: CT HEAD WITHOUT CONTRAST CT CERVICAL SPINE WITHOUT CONTRAST TECHNIQUE: Multidetector CT imaging of the head and cervical spine was performed following the standard protocol without intravenous contrast. Multiplanar CT image reconstructions of the cervical spine were also generated. COMPARISON:  CT head 04/14/2016 FINDINGS: CT HEAD FINDINGS Brain:  Generalized atrophy unchanged. Extensive white matter hypodensity diffusely unchanged and consistent with microvascular ischemia. Negative for acute hemorrhage. Negative for acute infarct or mass. Vascular: No hyperdense vessel or unexpected calcification. Skull: Negative for skull fracture. Sinuses/Orbits: Negative Other: None CT CERVICAL SPINE FINDINGS Alignment: Mild retrolisthesis C3-4 and C4-5. Skull base and vertebrae: Negative for fracture or mass. No acute skeletal abnormality. Soft tissues and spinal canal: Negative Disc levels: Disc degeneration and spurring C4 through C7. Mild facet degeneration throughout the cervical spine. Upper chest: Negative Other: None IMPRESSION: Atrophy and chronic microvascular ischemia. No acute intracranial abnormality. Frontal scalp laceration Cervical spondylosis.  Negative for fracture. Electronically Signed   By: Marlan Palau M.D.   On: 08/03/2016 19:00   Ct Cervical Spine Wo Contrast  Result Date: 08/03/2016 CLINICAL DATA:  Fall.  Head laceration EXAM: CT HEAD WITHOUT CONTRAST CT CERVICAL SPINE WITHOUT CONTRAST TECHNIQUE: Multidetector CT imaging of the head and cervical spine was performed following the  standard protocol without intravenous contrast. Multiplanar CT image reconstructions of the cervical spine were also generated. COMPARISON:  CT head 04/14/2016 FINDINGS: CT HEAD FINDINGS Brain: Generalized atrophy unchanged. Extensive white matter hypodensity diffusely unchanged and consistent with microvascular ischemia. Negative for acute hemorrhage. Negative for acute infarct or mass. Vascular: No hyperdense vessel or unexpected calcification. Skull: Negative for skull fracture. Sinuses/Orbits: Negative Other: None CT CERVICAL SPINE FINDINGS Alignment: Mild retrolisthesis C3-4 and C4-5. Skull base and vertebrae: Negative for fracture or mass. No acute skeletal abnormality. Soft tissues and spinal canal: Negative Disc levels: Disc degeneration and spurring C4 through C7. Mild facet degeneration throughout the cervical spine. Upper chest: Negative Other: None IMPRESSION: Atrophy and chronic microvascular ischemia. No acute intracranial abnormality. Frontal scalp laceration Cervical spondylosis.  Negative for fracture. Electronically Signed   By: Marlan Palau M.D.   On: 08/03/2016 19:00   Dg Knee Complete 4 Views Left  Result Date: 08/03/2016 CLINICAL DATA:  Left knee pain following fall.  Initial encounter. EXAM: LEFT KNEE - COMPLETE 4+ VIEW COMPARISON:  None. FINDINGS: There is no evidence of acute fracture, subluxation, dislocation or joint effusion. Mild tricompartmental degenerative changes are noted. No focal bony lesions are present. IMPRESSION: No evidence of acute abnormality. Electronically Signed   By: Harmon Pier M.D.   On: 08/03/2016 18:45    Procedures Procedures (including critical care time)  Medications Ordered in ED Medications - No data to display   Initial Impression / Assessment and Plan / ED Course  I have reviewed the triage vital signs and the nursing notes.  Pertinent labs & imaging results that were available during my care of the patient were reviewed by me and  considered in my medical decision making (see chart for details).  Clinical Course    82 year old female with dementia who fell today at nursing home. She has a laceration to her forehead that has had care prehospital and Steri-Stripped and appears adequately repaired      Patient ambulated here and taking po well.   Final Clinical Impressions(s) / ED Diagnoses   Final diagnoses:  Fall, initial encounter  Laceration of head without foreign body, unspecified part of head, initial encounter  Skin tear of right forearm without complication, initial encounter  Contusion of left knee, initial encounter    New Prescriptions New Prescriptions   No medications on file     Margarita Grizzle, MD 08/03/16 2040

## 2016-08-03 NOTE — ED Notes (Signed)
MD at bedside. 

## 2016-08-03 NOTE — ED Notes (Addendum)
Attempted to ambulate pt with Tori NT. Pt would not put any weight on legs/feet to ambulate. We held pt up for a minute before we lowered her back onto the bed.

## 2016-08-03 NOTE — ED Notes (Signed)
Family concerned that patient is complaining of her left hand hurting.  Contusion noted to palm of hand near base of thumb.

## 2016-08-03 NOTE — ED Notes (Signed)
Patient ambulated with assistance for myself and Dr. Rosalia Hammers.  Calling [facility] at this time to give report and advise on transporting patient back to facility.

## 2016-08-03 NOTE — Discharge Instructions (Signed)
Please walk only with assistance Keep Steri-Strips in place until they come off on their own Use Tylenol as needed for pain.

## 2016-08-03 NOTE — ED Notes (Signed)
Report to [place], LPN at [facility].  Requesting [facility] to transport patient back to facility.

## 2016-08-03 NOTE — ED Triage Notes (Signed)
Patient brought in via EMS from [facility] for fall. Patient had unwitnessed fall. Per staff bed alarm heard and patient found in floor. Patient alert but confused-patient has hx of dementia. Unsure of LOC. Hematome and laceration to forehead dressed with strei-strips and Band-Aid. Dressing clean, dry, and intact. Denies any tenderness and no guarding noted with palpitation of hips. Patient does report some tenderness with palpitation of left knee.

## 2016-08-14 ENCOUNTER — Ambulatory Visit (INDEPENDENT_AMBULATORY_CARE_PROVIDER_SITE_OTHER): Payer: Medicare Other | Admitting: Sports Medicine

## 2016-08-14 ENCOUNTER — Ambulatory Visit (INDEPENDENT_AMBULATORY_CARE_PROVIDER_SITE_OTHER): Payer: Medicare Other

## 2016-08-14 ENCOUNTER — Encounter (INDEPENDENT_AMBULATORY_CARE_PROVIDER_SITE_OTHER): Payer: Self-pay | Admitting: Sports Medicine

## 2016-08-14 VITALS — BP 122/68 | HR 78 | Ht 64.0 in | Wt 141.0 lb

## 2016-08-14 DIAGNOSIS — M79641 Pain in right hand: Secondary | ICD-10-CM | POA: Diagnosis not present

## 2016-08-14 DIAGNOSIS — S62324A Displaced fracture of shaft of fourth metacarpal bone, right hand, initial encounter for closed fracture: Secondary | ICD-10-CM | POA: Diagnosis not present

## 2016-08-14 NOTE — Progress Notes (Signed)
Sherry SanesLila R Fox - 82 y.o. female MRN [id number]  Date of birth: 1929-11-15  Office Visit Note: Visit Date: 08/14/2016 PCP: Colon BranchQURESHI, AYYAZ, MD Referred by: Colon BranchQureshi, Ayyaz, MD  Assessment & Plan: Visit Diagnoses:  1. Pain in right hand   2. Closed displaced fracture of shaft of fourth metacarpal bone of right hand, initial encounter     Plan:   recurrent injury to the right fourth &. Some fracture appears to be a nonunion at should do well. She does have a minimally displaced spiral fracture of the fourth metacarpal & recommend splint immobilization at this time. This is a Stable fracture & should do quite well.    There are no Patient Instructions on file for this visit.  Meds & Orders: No orders of the defined types were placed in this encounter.   Orders Placed This Encounter  Procedures  . XR Hand Complete Right    Follow-up: Return in about 3 weeks (around 09/04/2016) for repeat X-rays.   Procedures: No procedures performed  No notes on file   Clinical History: No additional findings.  R Hand dominant.  [facility] Recurrent Fall on 08/03/16 - R Hand re-injury  She reports that she has never smoked. She has never used smokeless tobacco. No results for input(s): HGBA1C, LABURIC in the last 8760 hours.  Subjective: Chief Complaint  Patient presents with  . Right Hand - Fracture  . Follow-up    Right Thumb    HPI: Patient presents after a recurrent fall the nursing home on 08/03/2016. We'll returning from being evaluated in the emergency department right hand pain & swelling were noted with worsening bruising. X-rays were reportedly obtained there but are unavailable. She has focal tenderness over the fourth metacarpal. Her prior thumb fracture is healing well although she does hold her thumb is slightly flexed position. Patient states doing okay, right thumb hurts at times.  Not wearing brace at this time.  [facility]    ROS Otherwise per HPI.  Objective:  VS:  HT:5\' 4"  (162.6 cm)   WT:141 lb (64 kg)  BMI:24.3    BP:122/68  HR:78bpm  TEMP: ( )  RESP:  Physical Exam  Constitutional: No distress.  HENT:  Head: Normocephalic and atraumatic.  Eyes: Right eye exhibits no discharge. Left eye exhibits no discharge. No scleral icterus.  Pulmonary/Chest: Effort normal. No respiratory distress.  Neurological: She is alert.  Appropriately interactive.  Skin: Skin is warm and dry. No rash noted. She is not diaphoretic. No erythema. No pallor.  Psychiatric: Judgment normal.    Right Hand Exam   Comments:  Moderate degenerative changes throughout infusion IP joints. She has focal tenderness to palpation over the fourth metacarpal with palpable callus appreciated. Radial pulses on her pulses intact. Capillary refill < 3 seconds. Limited flexion of the thumb.     Imaging: Dg Pelvis 1-2 Views  Result Date: 08/03/2016 CLINICAL DATA:  Pelvic pain following fall.  Initial encounter. EXAM: PELVIS - 1-2 VIEW COMPARISON:  None. FINDINGS: There is no evidence of pelvic fracture or diastasis. No pelvic bone lesions are seen. IMPRESSION: Negative. Electronically Signed   By: Harmon PierJeffrey  Hu M.D.   On: 08/03/2016 18:43   Ct Head Wo Contrast  Result Date: 08/03/2016 CLINICAL DATA:  Fall.  Head laceration EXAM: CT HEAD WITHOUT CONTRAST CT CERVICAL SPINE WITHOUT CONTRAST TECHNIQUE: Multidetector CT imaging of the head and cervical spine was performed  following the standard protocol without intravenous contrast. Multiplanar CT image reconstructions of the cervical spine were also generated. COMPARISON:  CT head 04/14/2016 FINDINGS: CT HEAD FINDINGS Brain: Generalized atrophy unchanged. Extensive white matter hypodensity diffusely unchanged and consistent with microvascular ischemia. Negative for acute hemorrhage. Negative for acute infarct or mass. Vascular: No hyperdense vessel or unexpected calcification.  Skull: Negative for skull fracture. Sinuses/Orbits: Negative Other: None CT CERVICAL SPINE FINDINGS Alignment: Mild retrolisthesis C3-4 and C4-5. Skull base and vertebrae: Negative for fracture or mass. No acute skeletal abnormality. Soft tissues and spinal canal: Negative Disc levels: Disc degeneration and spurring C4 through C7. Mild facet degeneration throughout the cervical spine. Upper chest: Negative Other: None IMPRESSION: Atrophy and chronic microvascular ischemia. No acute intracranial abnormality. Frontal scalp laceration Cervical spondylosis.  Negative for fracture. Electronically Signed   By: Marlan Palau M.D.   On: 08/03/2016 19:00   Ct Cervical Spine Wo Contrast  Result Date: 08/03/2016 CLINICAL DATA:  Fall.  Head laceration EXAM: CT HEAD WITHOUT CONTRAST CT CERVICAL SPINE WITHOUT CONTRAST TECHNIQUE: Multidetector CT imaging of the head and cervical spine was performed following the standard protocol without intravenous contrast. Multiplanar CT image reconstructions of the cervical spine were also generated. COMPARISON:  CT head 04/14/2016 FINDINGS: CT HEAD FINDINGS Brain: Generalized atrophy unchanged. Extensive white matter hypodensity diffusely unchanged and consistent with microvascular ischemia. Negative for acute hemorrhage. Negative for acute infarct or mass. Vascular: No hyperdense vessel or unexpected calcification. Skull: Negative for skull fracture. Sinuses/Orbits: Negative Other: None CT CERVICAL SPINE FINDINGS Alignment: Mild retrolisthesis C3-4 and C4-5. Skull base and vertebrae: Negative for fracture or mass. No acute skeletal abnormality. Soft tissues and spinal canal: Negative Disc levels: Disc degeneration and spurring C4 through C7. Mild facet degeneration throughout the cervical spine. Upper chest: Negative Other: None IMPRESSION: Atrophy and chronic microvascular ischemia. No acute intracranial abnormality. Frontal scalp laceration Cervical spondylosis.  Negative for  fracture. Electronically Signed   By: Marlan Palau M.D.   On: 08/03/2016 19:00   Dg Knee Complete 4 Views Left  Result Date: 08/03/2016 CLINICAL DATA:  Left knee pain following fall.  Initial encounter. EXAM: LEFT KNEE - COMPLETE 4+ VIEW COMPARISON:  None. FINDINGS: There is no evidence of acute fracture, subluxation, dislocation or joint effusion. Mild tricompartmental degenerative changes are noted. No focal bony lesions are present. IMPRESSION: No evidence of acute abnormality. Electronically Signed   By: Harmon Pier M.D.   On: 08/03/2016 18:45   Xr Hand Complete Right  Result Date: 08/14/2016 Findings: Spiral fracture of the fourth metacarpal with minimal displacement. Overall good alignment. Significant degenerative changes of the MCPs & IP joints. Prior thumb fracture continues to heal other there does appear to be a segment along the ulnar aspect of the joint line that is likely a partial nonunion/malunion. Impression: Malunion of the thumb fracture with acute minimally displaced spiral fracture of the fourth metacarpal    Past Medical/Family/Surgical/Social History: Patient Active Problem List   Diagnosis Date Noted  . Diastolic dysfunction 01/17/2015  . CKD (chronic kidney disease) stage 3, GFR 30-59 ml/min 01/15/2015  . Rash and nonspecific skin eruption 01/15/2015  . Hypotension 01/14/2015  . Sepsis (HCC) 01/14/2015  . Cellulitis of left leg 01/14/2015  . Dementia 01/14/2015  . Encephalopathy 01/14/2015  . Acute renal failure (HCC) 01/14/2015  . Hallucination 03/03/2014  . Altered mental status 03/02/2014  . Hypnagogic hallucinations 03/02/2014  . Depression 03/02/2014  . Essential hypertension, benign 04/11/2013  . Morbid obesity (HCC) 04/11/2013  .  Other and unspecified hyperlipidemia 04/11/2013  . Diverticulosis of colon without hemorrhage 04/11/2013  . DJD (degenerative joint disease) 04/11/2013   Past Medical History:  Diagnosis Date  . Anemia   . Anxiety   .  Arthritis   . Cardiac murmur   . Cellulitis of left leg 01/14/2015  . CHF (congestive heart failure) (HCC)   . CKD (chronic kidney disease) stage 3, GFR 30-59 ml/min 01/15/2015  . Dementia   . Diastolic dysfunction 01/17/2015   EF 60-65%. Grade 1 diastolic dysfunction.  Marland Kitchen Dysphagia   . Hypercholesteremia   . Hypertension   . Hypokalemia   . Hypokalemia   . Major depressive disorder   . Muscle weakness   . Psychoses   . Pulmonary edema    Family History  Problem Relation Age of Onset  . Diabetes Father    Past Surgical History:  Procedure Laterality Date  . KIDNEY CYST REMOVAL     Social History   Occupational History  . Not on file.   Social History Main Topics  . Smoking status: Never Smoker  . Smokeless tobacco: Never Used  . Alcohol use No  . Drug use: No  . Sexual activity: Not on file

## 2016-08-29 IMAGING — CR DG HUMERUS 2V *R*
2 series · 2 of 2 positions shown · non-contrast
Comparison: None.

CLINICAL DATA: Pain shoulder and elbow. Fall while shopping this
a.m.. Initial evaluation.

EXAM:
RIGHT HUMERUS - 2+ VIEW

[view not recorded (1 of 2)]
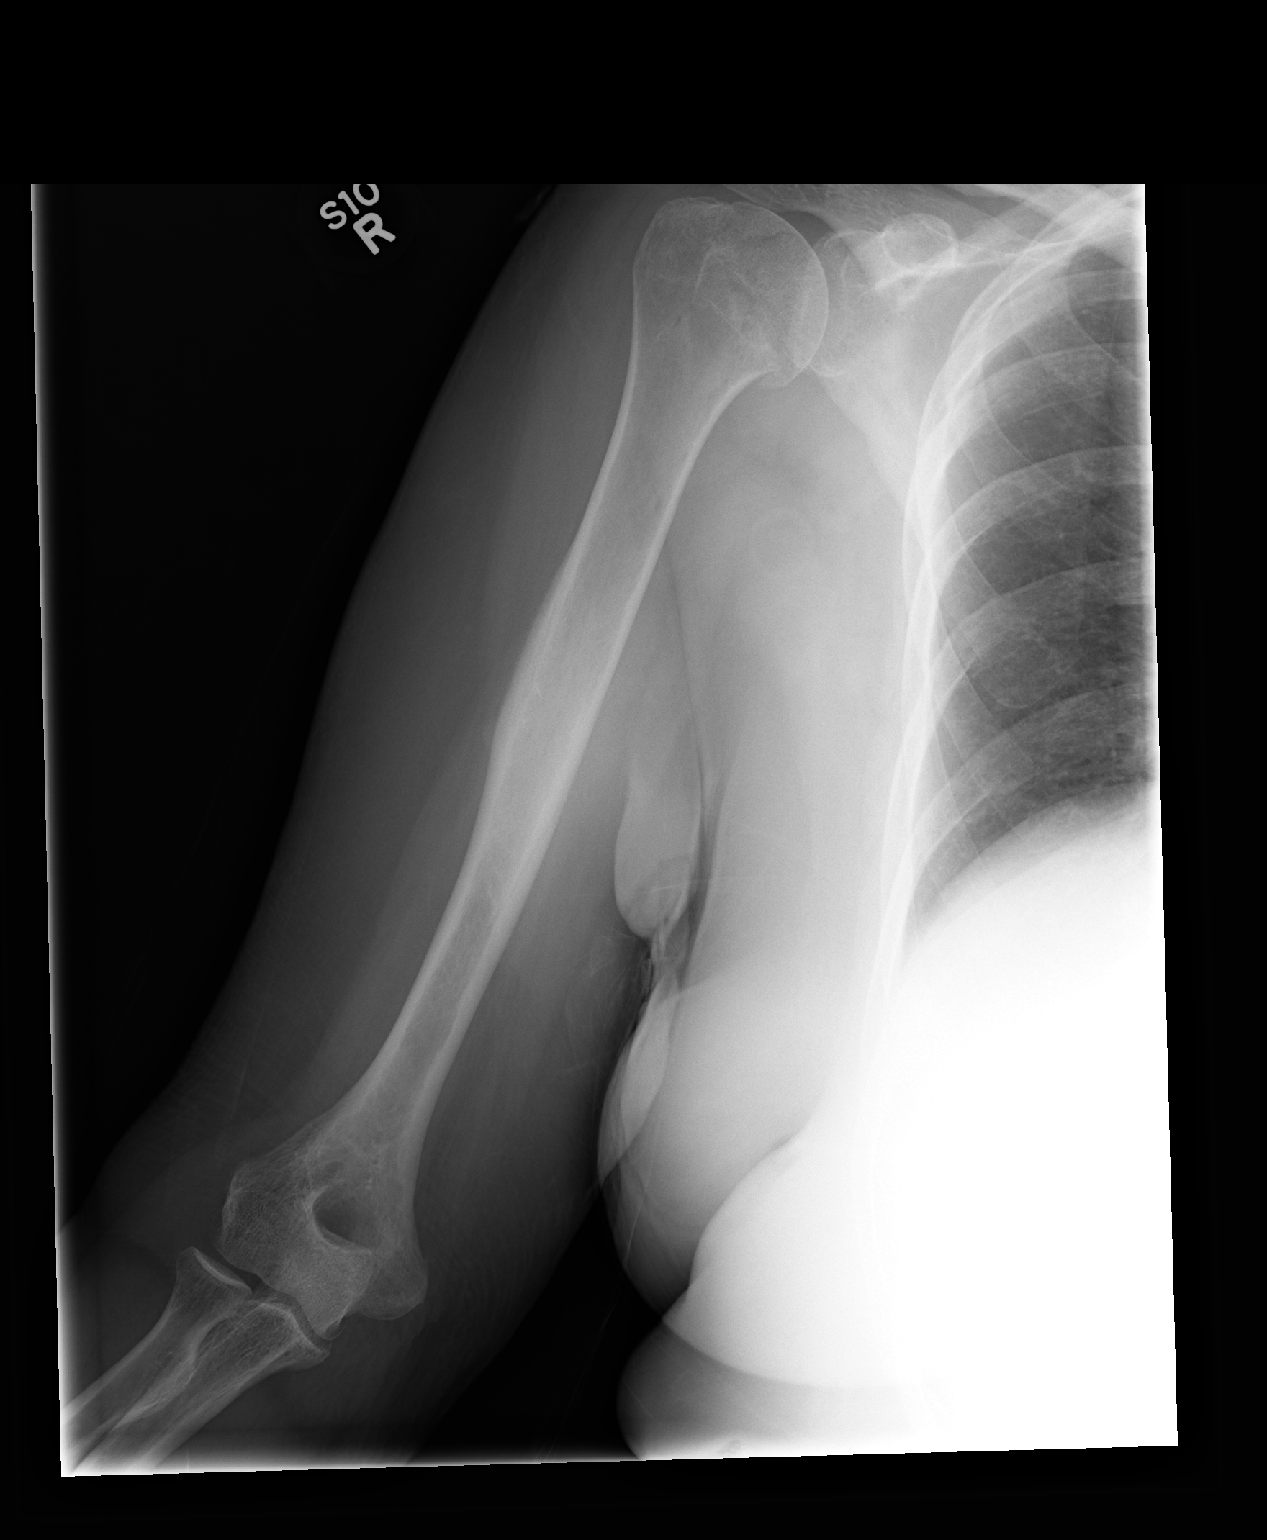

[view not recorded (2 of 2)]
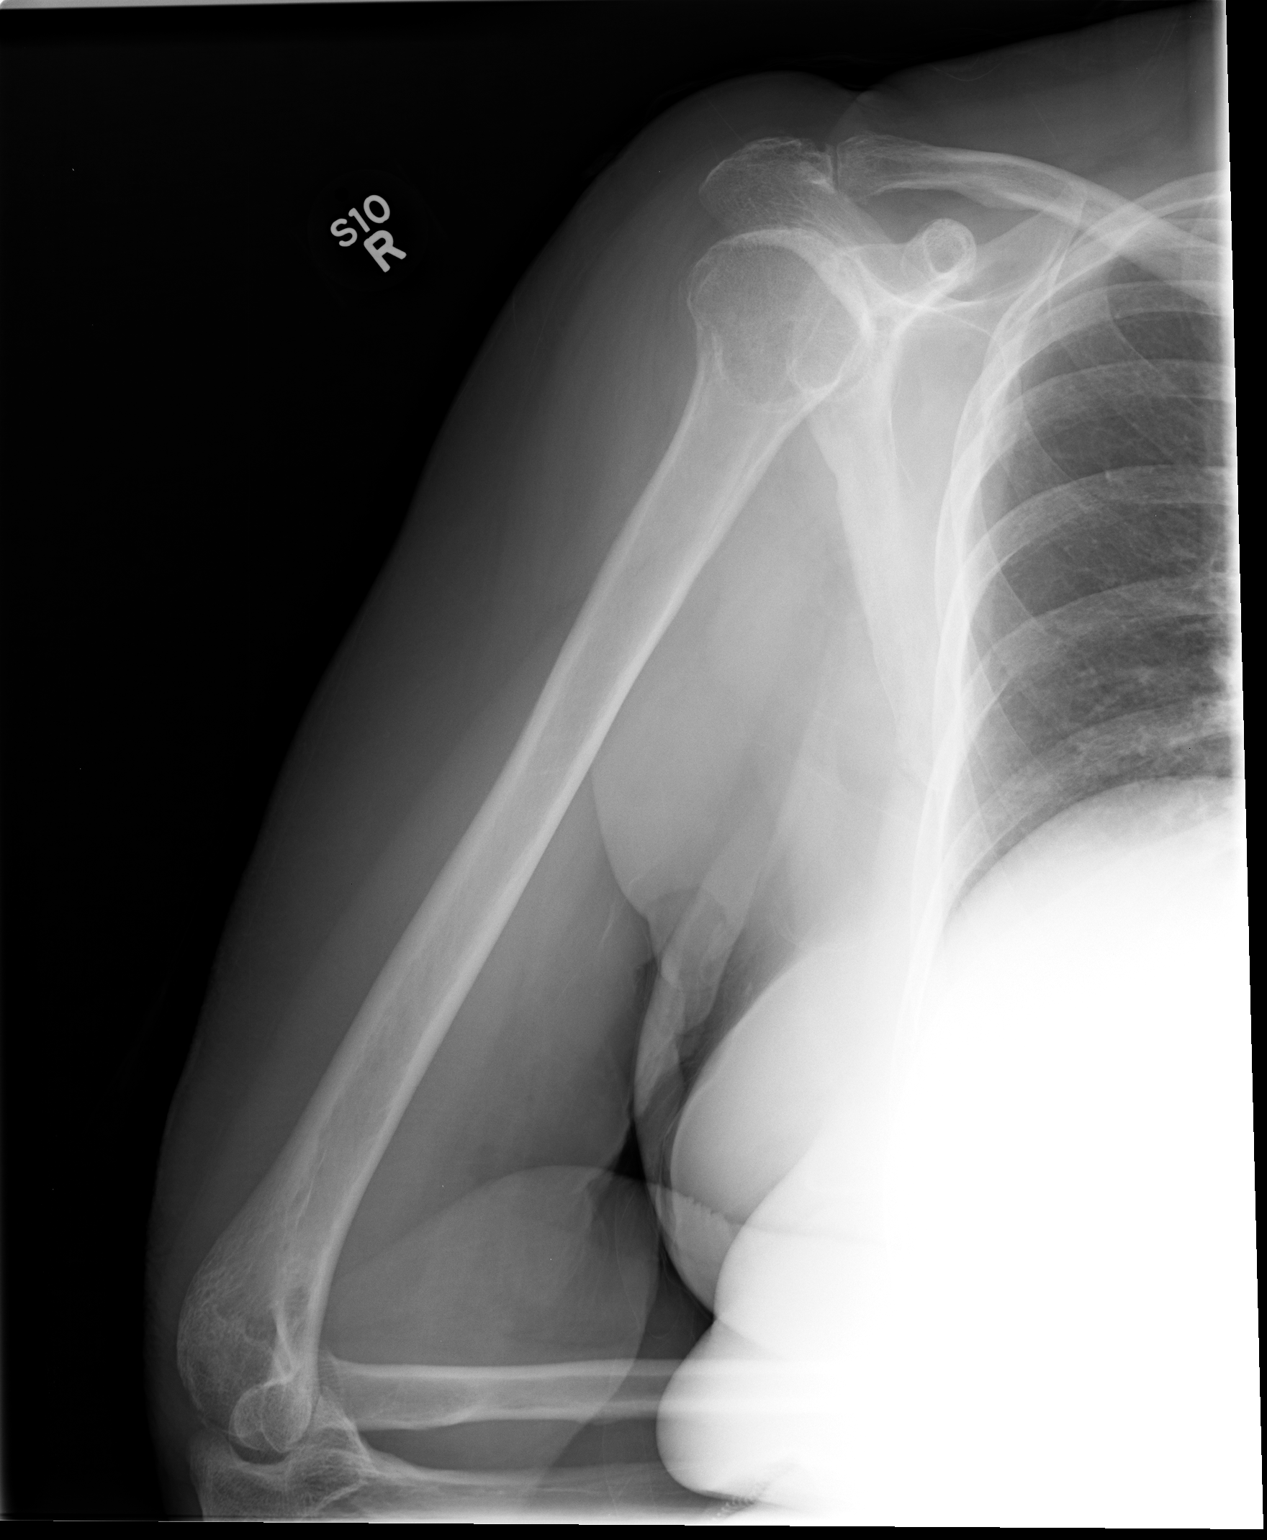

[2 of 2 positions shown; findings below may reference images not displayed]

FINDINGS: Nondisplaced fracture of the right humeral head is noted. Humeral
shaft and distal humerus intact. The humeral head fracture may
extend into the glenohumeral joint space.
IMPRESSION: Nondisplaced right humeral head fracture.

## 2016-08-29 IMAGING — CT CT HEAD W/O CM
1 series · 15 of 30 positions shown, 19 images · non-contrast
Comparison: 03/02/2014

CLINICAL DATA: Fall today and hit head on carpet on floor; has
abrasion to RT forehead about 2 cm above RT eyebrow/ no headache at
this time

EXAM:
CT HEAD WITHOUT CONTRAST
TECHNIQUE: Contiguous axial images were obtained from the base of the skull
through the vertex without intravenous contrast.

[Series 2: headseq 4.8 h37s · axial · 0.49mm/px · z∈[+28,+185]mm · 15 of 36 slices shown, 19 images]
[im 2/36  brain]
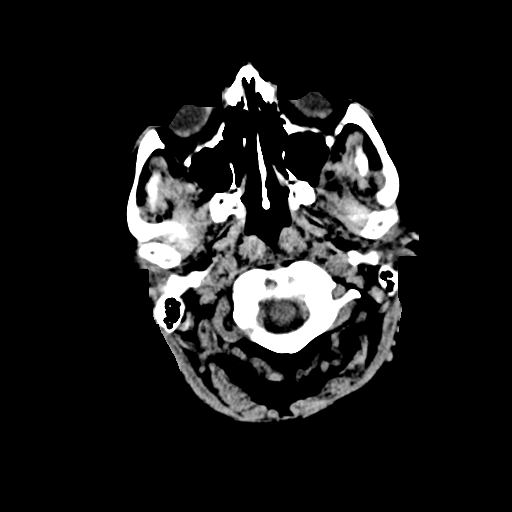
[im 2/36  bone]
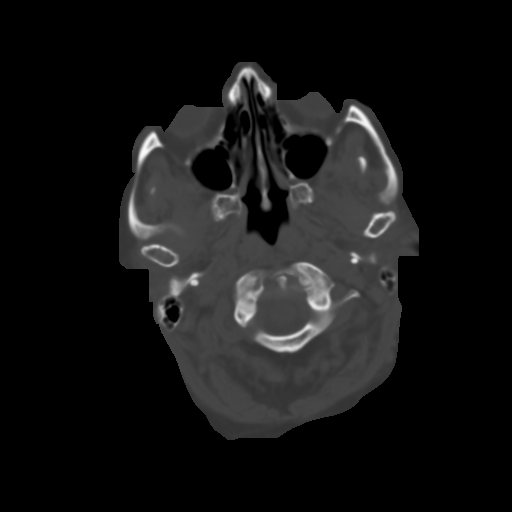
[im 4/36  brain]
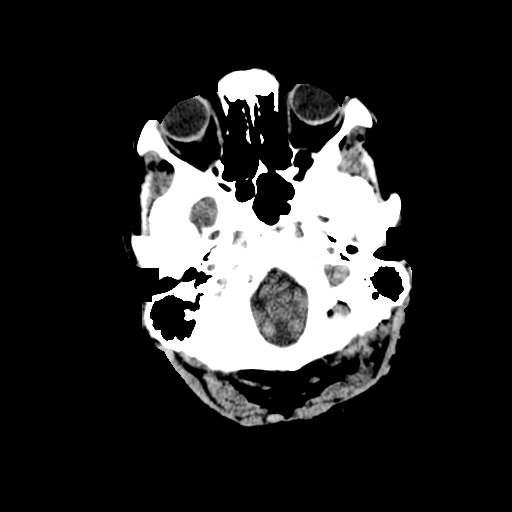
[im 7/36  brain]
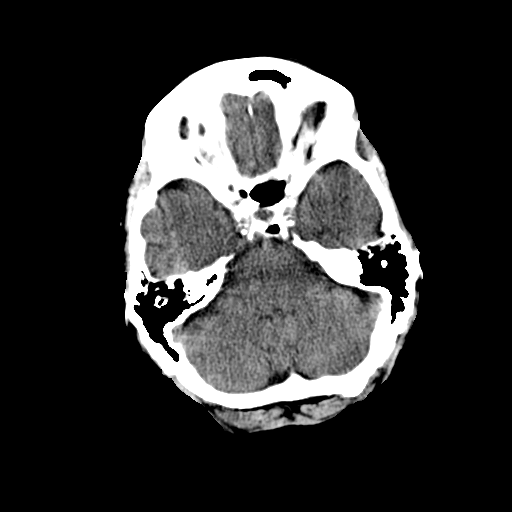
[im 9/36  brain]
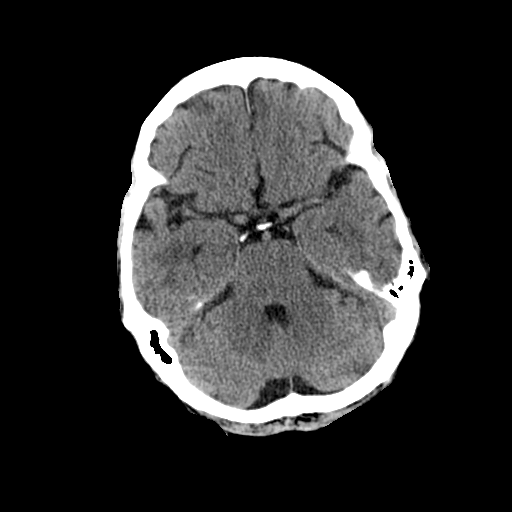
[im 11/36  brain]
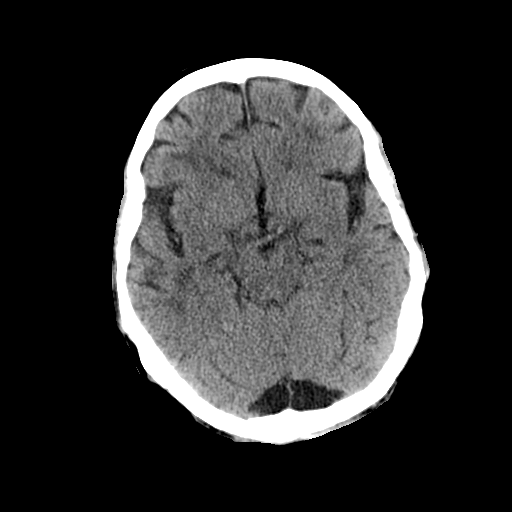
[im 11/36  bone]
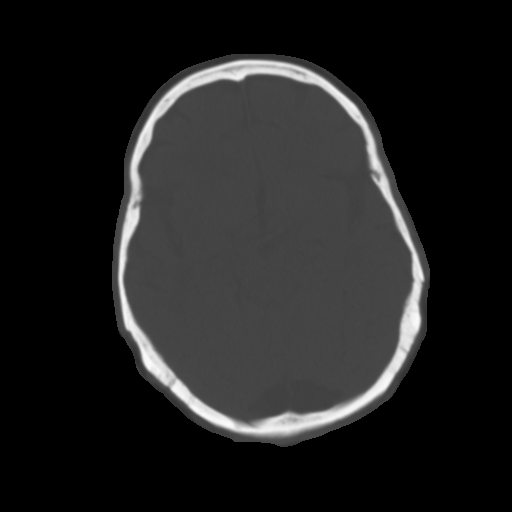
[im 14/36  brain]
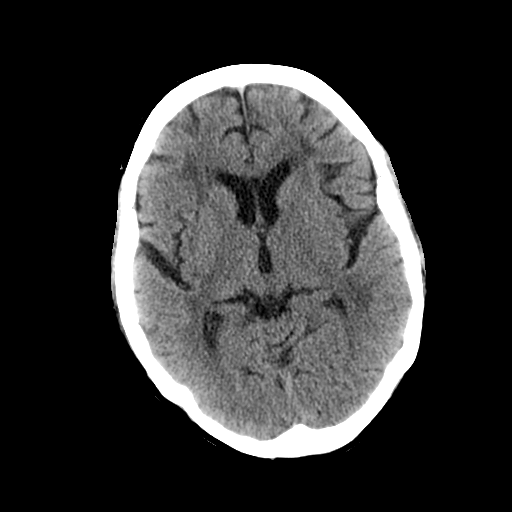
[im 16/36  brain]
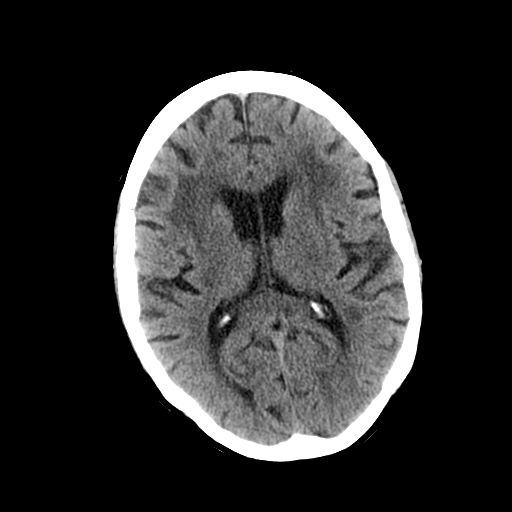
[im 19/36  brain]
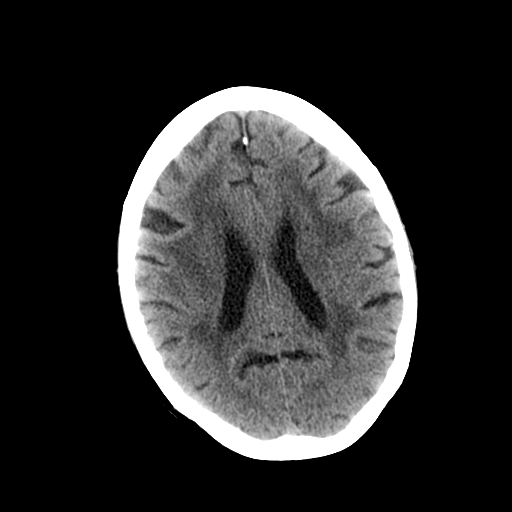
[im 20/36  brain]
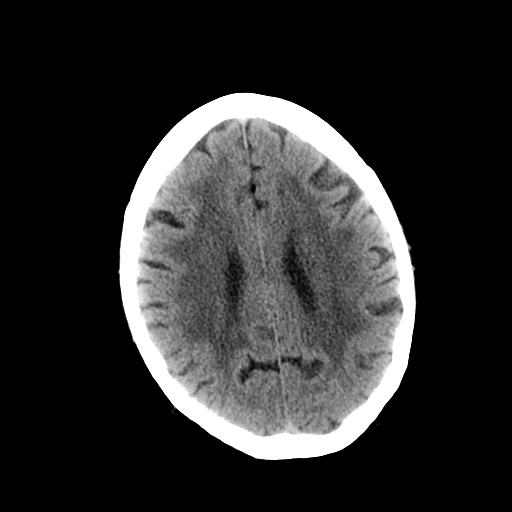
[im 20/36  bone]
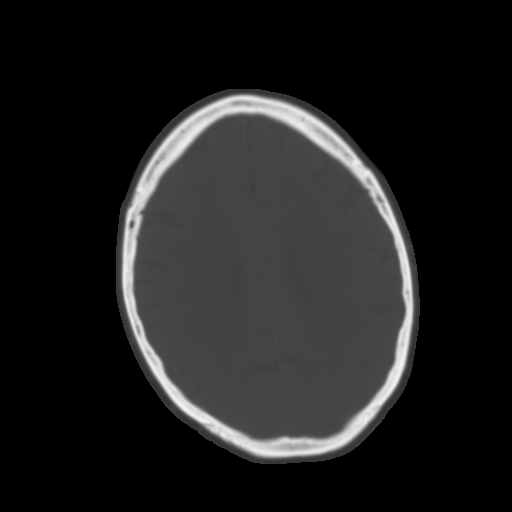
[im 22/36  brain]
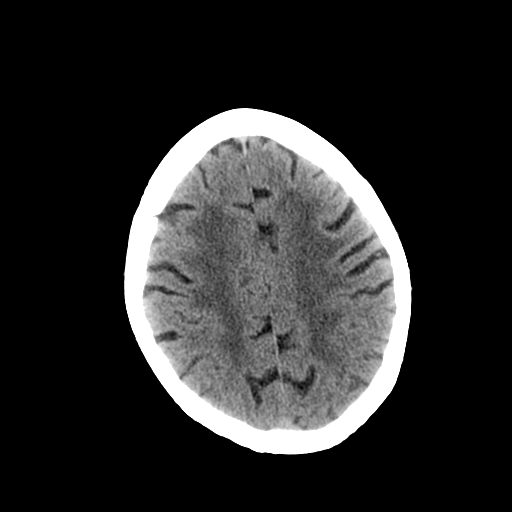
[im 25/36  brain]
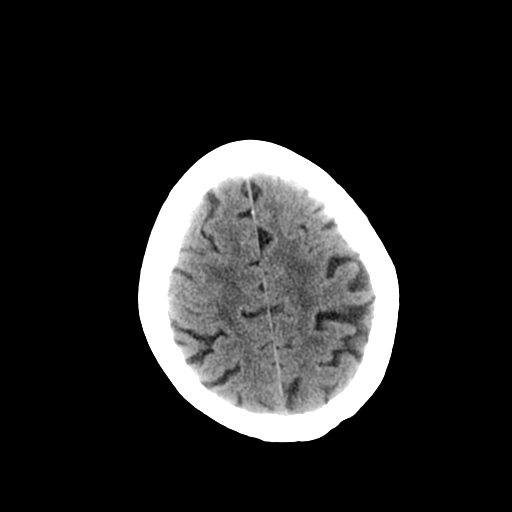
[im 27/36  brain]
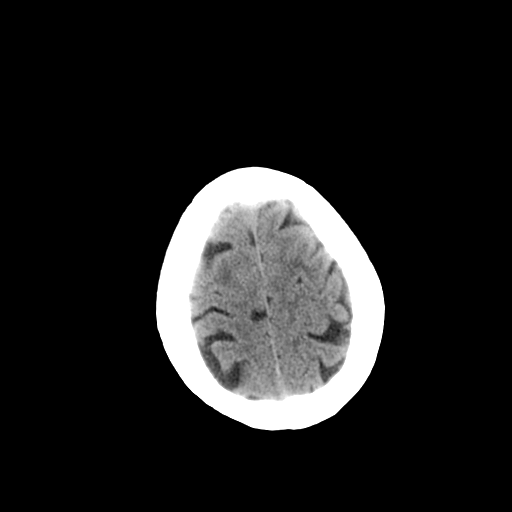
[im 29/36  brain]
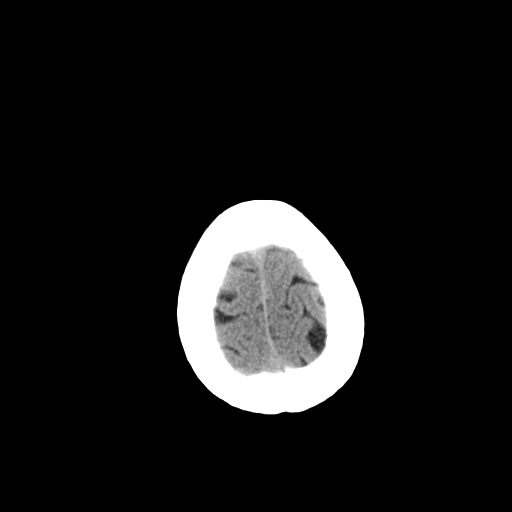
[im 29/36  bone]
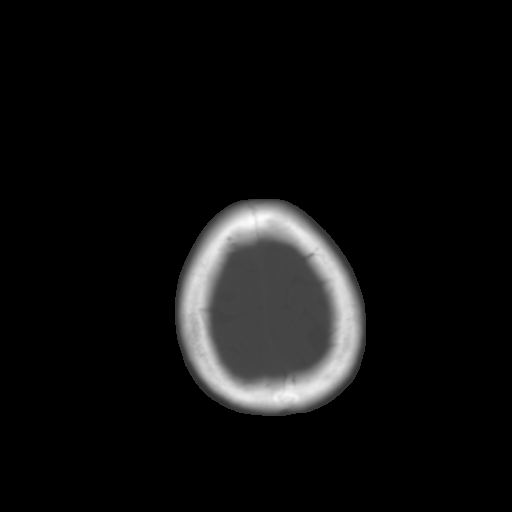
[im 32/36  brain]
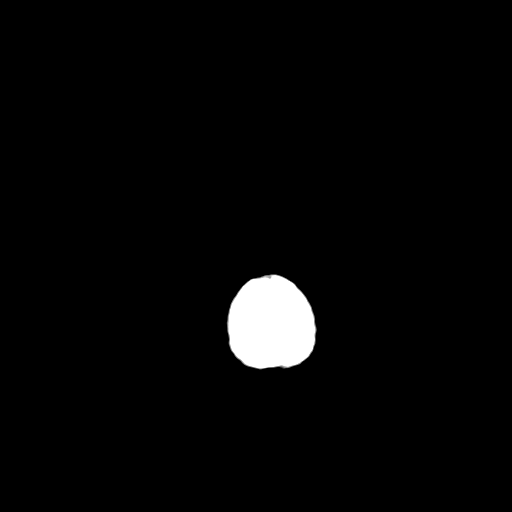
[im 34/36  brain]
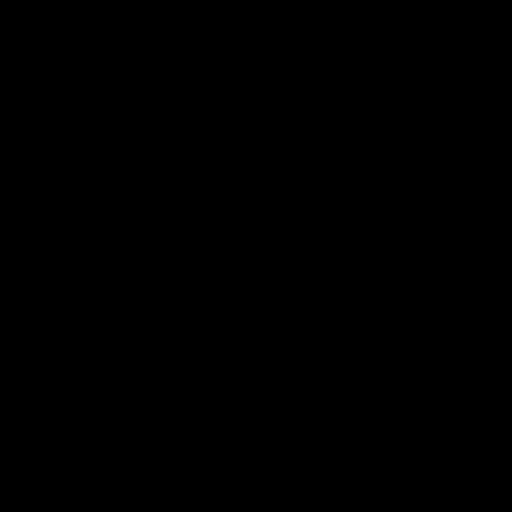

[15 of 30 positions shown; findings below may reference images not displayed]

FINDINGS: There is no evidence of mass effect, midline shift, or extra-axial
fluid collections. There is no evidence of a space-occupying lesion
or intracranial hemorrhage. There is no evidence of a cortical-based
area of acute infarction. There is generalized cerebral atrophy.
There is periventricular white matter low attenuation likely
secondary to microangiopathy.

The ventricles and sulci are appropriate for the patient's age. The
basal cisterns are patent.

Visualized portions of the orbits are unremarkable. The visualized
portions of the paranasal sinuses and mastoid air cells are
unremarkable. Cerebrovascular atherosclerotic calcifications are
noted.

The osseous structures are unremarkable.
IMPRESSION: No acute intracranial pathology.

## 2016-09-04 ENCOUNTER — Ambulatory Visit (INDEPENDENT_AMBULATORY_CARE_PROVIDER_SITE_OTHER): Payer: Medicare Other | Admitting: Sports Medicine

## 2016-11-24 ENCOUNTER — Observation Stay (HOSPITAL_COMMUNITY)
Admission: EM | Admit: 2016-11-24 | Discharge: 2016-11-27 | Disposition: A | Discharge status: Skilled Nursing Facility | Payer: Medicare Other | Attending: Internal Medicine | Admitting: Internal Medicine

## 2016-11-24 ENCOUNTER — Encounter (HOSPITAL_COMMUNITY): Payer: Self-pay | Admitting: Emergency Medicine

## 2016-11-24 ENCOUNTER — Emergency Department (HOSPITAL_COMMUNITY): Payer: Medicare Other

## 2016-11-24 DIAGNOSIS — N183 Chronic kidney disease, stage 3 (moderate): Secondary | ICD-10-CM | POA: Diagnosis not present

## 2016-11-24 DIAGNOSIS — I10 Essential (primary) hypertension: Secondary | ICD-10-CM | POA: Diagnosis present

## 2016-11-24 DIAGNOSIS — G3189 Other specified degenerative diseases of nervous system: Secondary | ICD-10-CM | POA: Insufficient documentation

## 2016-11-24 DIAGNOSIS — K209 Esophagitis, unspecified: Secondary | ICD-10-CM | POA: Insufficient documentation

## 2016-11-24 DIAGNOSIS — Z87891 Personal history of nicotine dependence: Secondary | ICD-10-CM | POA: Diagnosis not present

## 2016-11-24 DIAGNOSIS — F419 Anxiety disorder, unspecified: Secondary | ICD-10-CM | POA: Diagnosis not present

## 2016-11-24 DIAGNOSIS — Z888 Allergy status to other drugs, medicaments and biological substances status: Secondary | ICD-10-CM | POA: Diagnosis not present

## 2016-11-24 DIAGNOSIS — G20 Parkinson's disease: Secondary | ICD-10-CM | POA: Diagnosis not present

## 2016-11-24 DIAGNOSIS — E876 Hypokalemia: Secondary | ICD-10-CM | POA: Diagnosis not present

## 2016-11-24 DIAGNOSIS — F0390 Unspecified dementia without behavioral disturbance: Secondary | ICD-10-CM | POA: Insufficient documentation

## 2016-11-24 DIAGNOSIS — F329 Major depressive disorder, single episode, unspecified: Secondary | ICD-10-CM | POA: Diagnosis not present

## 2016-11-24 DIAGNOSIS — K922 Gastrointestinal hemorrhage, unspecified: Secondary | ICD-10-CM | POA: Diagnosis present

## 2016-11-24 DIAGNOSIS — K449 Diaphragmatic hernia without obstruction or gangrene: Secondary | ICD-10-CM | POA: Diagnosis not present

## 2016-11-24 DIAGNOSIS — K5909 Other constipation: Secondary | ICD-10-CM | POA: Diagnosis not present

## 2016-11-24 DIAGNOSIS — E7800 Pure hypercholesterolemia, unspecified: Secondary | ICD-10-CM | POA: Diagnosis not present

## 2016-11-24 DIAGNOSIS — W19XXXA Unspecified fall, initial encounter: Secondary | ICD-10-CM

## 2016-11-24 DIAGNOSIS — I130 Hypertensive heart and chronic kidney disease with heart failure and stage 1 through stage 4 chronic kidney disease, or unspecified chronic kidney disease: Secondary | ICD-10-CM | POA: Insufficient documentation

## 2016-11-24 DIAGNOSIS — Z7982 Long term (current) use of aspirin: Secondary | ICD-10-CM | POA: Insufficient documentation

## 2016-11-24 DIAGNOSIS — M1990 Unspecified osteoarthritis, unspecified site: Secondary | ICD-10-CM | POA: Diagnosis not present

## 2016-11-24 DIAGNOSIS — K920 Hematemesis: Principal | ICD-10-CM | POA: Insufficient documentation

## 2016-11-24 DIAGNOSIS — I509 Heart failure, unspecified: Secondary | ICD-10-CM | POA: Insufficient documentation

## 2016-11-24 DIAGNOSIS — G9340 Encephalopathy, unspecified: Secondary | ICD-10-CM | POA: Diagnosis not present

## 2016-11-24 DIAGNOSIS — Z66 Do not resuscitate: Secondary | ICD-10-CM | POA: Diagnosis not present

## 2016-11-24 DIAGNOSIS — R011 Cardiac murmur, unspecified: Secondary | ICD-10-CM | POA: Insufficient documentation

## 2016-11-24 LAB — URINALYSIS, ROUTINE W REFLEX MICROSCOPIC
BACTERIA UA: NONE SEEN
Bilirubin Urine: NEGATIVE
Glucose, UA: 50 mg/dL — AB
Hgb urine dipstick: NEGATIVE
Ketones, ur: 20 mg/dL — AB
Leukocytes, UA: NEGATIVE
Nitrite: NEGATIVE
PH: 8 (ref 5.0–8.0)
Protein, ur: 30 mg/dL — AB
SPECIFIC GRAVITY, URINE: 1.012 (ref 1.005–1.030)

## 2016-11-24 LAB — COMPREHENSIVE METABOLIC PANEL
ALT: 23 U/L (ref 14–54)
AST: 23 U/L (ref 15–41)
Albumin: 3.9 g/dL (ref 3.5–5.0)
Alkaline Phosphatase: 59 U/L (ref 38–126)
Anion gap: 11 (ref 5–15)
BUN: 17 mg/dL (ref 6–20)
CO2: 29 mmol/L (ref 22–32)
CREATININE: 0.97 mg/dL (ref 0.44–1.00)
Calcium: 8.7 mg/dL — ABNORMAL LOW (ref 8.9–10.3)
Chloride: 98 mmol/L — ABNORMAL LOW (ref 101–111)
GFR calc Af Amer: >60 mL/min (ref >60)
GFR calc non Af Amer: 52 mL/min — ABNORMAL LOW (ref >60)
Glucose, Bld: 199 mg/dL — ABNORMAL HIGH (ref 65–99)
Potassium: 3.5 mmol/L (ref 3.5–5.1)
SODIUM: 138 mmol/L (ref 135–145)
Total Bilirubin: 1.1 mg/dL (ref 0.3–1.2)
Total Protein: 7.1 g/dL (ref 6.5–8.1)

## 2016-11-24 LAB — CBC WITH DIFFERENTIAL/PLATELET
BASOS ABS: 0 10*3/uL (ref 0.0–0.1)
Basophils Relative: 0 %
EOS PCT: 0 %
Eosinophils Absolute: 0 10*3/uL (ref 0.0–0.7)
HCT: 45 % (ref 36.0–46.0)
Hemoglobin: 15.2 g/dL — ABNORMAL HIGH (ref 12.0–15.0)
LYMPHS PCT: 3 %
Lymphs Abs: 0.5 10*3/uL — ABNORMAL LOW (ref 0.7–4.0)
MCH: 30.6 pg (ref 26.0–34.0)
MCHC: 33.8 g/dL (ref 30.0–36.0)
MCV: 90.7 fL (ref 78.0–100.0)
Monocytes Absolute: 0.4 10*3/uL (ref 0.1–1.0)
Monocytes Relative: 3 %
NEUTROS PCT: 94 %
Neutro Abs: 12.9 10*3/uL — ABNORMAL HIGH (ref 1.7–7.7)
PLATELETS: 204 10*3/uL (ref 150–400)
RBC: 4.96 MIL/uL (ref 3.87–5.11)
RDW: 13.5 % (ref 11.5–15.5)
WBC: 13.7 10*3/uL — AB (ref 4.0–10.5)

## 2016-11-24 LAB — MRSA PCR SCREENING: MRSA by PCR: NEGATIVE

## 2016-11-24 LAB — AMMONIA: Ammonia: 19 umol/L (ref 9–35)

## 2016-11-24 LAB — HEMOGLOBIN AND HEMATOCRIT, BLOOD
HCT: 40.6 % (ref 36.0–46.0)
Hemoglobin: 13.5 g/dL (ref 12.0–15.0)

## 2016-11-24 MED ORDER — ONDANSETRON HCL 4 MG PO TABS: 4.0000 mg | ORAL_TABLET | Freq: Four times a day (QID) | ORAL | Status: DC | PRN

## 2016-11-24 MED ORDER — SODIUM CHLORIDE 0.9 % IV BOLUS (SEPSIS)
500.0000 mL | Freq: Once | INTRAVENOUS | Status: AC
  Administered 2016-11-24: 500 mL via INTRAVENOUS

## 2016-11-24 MED ORDER — ONDANSETRON HCL 4 MG/2ML IJ SOLN
4.0000 mg | Freq: Four times a day (QID) | INTRAMUSCULAR | Status: DC | PRN
  Administered 2016-11-27: 4 mg via INTRAVENOUS
  Filled 2016-11-24: qty 2

## 2016-11-24 MED ORDER — ONDANSETRON HCL 4 MG/2ML IJ SOLN
4.0000 mg | Freq: Once | INTRAMUSCULAR | Status: AC
  Administered 2016-11-24: 4 mg via INTRAVENOUS
  Filled 2016-11-24: qty 2

## 2016-11-24 MED ORDER — PANTOPRAZOLE SODIUM 40 MG IV SOLR
40.0000 mg | Freq: Once | INTRAVENOUS | Status: AC
  Administered 2016-11-24: 40 mg via INTRAVENOUS
  Filled 2016-11-24: qty 40

## 2016-11-24 MED ORDER — SODIUM CHLORIDE 0.9 % IV SOLN
INTRAVENOUS | Status: DC
  Administered 2016-11-24 – 2016-11-27 (×5): via INTRAVENOUS

## 2016-11-24 MED ORDER — PANTOPRAZOLE SODIUM 40 MG IV SOLR
40.0000 mg | Freq: Two times a day (BID) | INTRAVENOUS | Status: DC
  Administered 2016-11-24 – 2016-11-25 (×2): 40 mg via INTRAVENOUS
  Filled 2016-11-24 (×2): qty 40

## 2016-11-24 NOTE — ED Provider Notes (Signed)
[facility]EMER[facility]Y DEPT Provider Note   CSN: [id number] Arrival date & time: 11/24/16  1417     History   Chief Complaint Chief Complaint  Patient presents with  . Hematemesis    HPI Sherry Fox is a 83 y.o. female.  Patient started vomiting blood Saturday. She vomited on Saturday Sunday and today.   The history is provided by a relative.  Emesis   This is a new problem. The current episode started more than 2 days ago. The problem occurs 5 to 10 times per day. The problem has not changed since onset.The emesis has an appearance of stomach contents. There has been no fever. Pertinent negatives include no chills.    Past Medical History:  Diagnosis Date  . Anemia   . Anxiety   . Arthritis   . Cardiac murmur   . Cellulitis of left leg 01/14/2015  . CHF (congestive heart failure) (HCC)   . CKD (chronic kidney disease) stage 3, GFR 30-59 ml/min 01/15/2015  . Dementia   . Diastolic dysfunction 01/17/2015   EF 60-65%. Grade 1 diastolic dysfunction.  Marland Kitchen Dysphagia   . Hypercholesteremia   . Hypertension   . Hypokalemia   . Hypokalemia   . Major depressive disorder   . Muscle weakness   . Psychoses   . Pulmonary edema     Patient Active Problem List   Diagnosis Date Noted  . Upper GI bleed 11/24/2016  . Diastolic dysfunction 01/17/2015  . CKD (chronic kidney disease) stage 3, GFR 30-59 ml/min 01/15/2015  . Rash and nonspecific skin eruption 01/15/2015  . Hypotension 01/14/2015  . Sepsis (HCC) 01/14/2015  . Cellulitis of left leg 01/14/2015  . Dementia 01/14/2015  . Encephalopathy 01/14/2015  . Acute renal failure (HCC) 01/14/2015  . Hallucination 03/03/2014  . Altered mental status 03/02/2014  . Hypnagogic hallucinations 03/02/2014  . Depression 03/02/2014  . Essential hypertension, benign 04/11/2013  . Morbid obesity (HCC) 04/11/2013  . Other and unspecified hyperlipidemia 04/11/2013  . Diverticulosis of colon without hemorrhage 04/11/2013  . DJD (degenerative  joint disease) 04/11/2013    Past Surgical History:  Procedure Laterality Date  . KIDNEY CYST REMOVAL      OB History    No data available       Home Medications    Prior to Admission medications   Medication Sig Start Date End Date Taking? Authorizing Provider  acetaminophen (TYLENOL) 500 MG tablet Take 500 mg by mouth every 8 (eight) hours.    Yes Historical Provider, MD  aspirin EC 81 MG tablet Take 81 mg by mouth daily.   Yes Historical Provider, MD  calcium carbonate (TUMS - DOSED IN MG ELEMENTAL CALCIUM) 500 MG chewable tablet Chew 1 tablet by mouth 3 (three) times daily.   Yes Historical Provider, MD  carbidopa-levodopa (SINEMET IR) 25-100 MG per tablet Take 1/2 pill twice daily x 1 week, then 1/2 pill 3 times a day thereafter. Patient taking differently: Take 0.5 tablets by mouth 3 (three) times daily. Take 1/2 pill twice daily x 1 week, then 1/2 pill 3 times a day thereafter. 02/13/15  Yes Huston Foley, MD  citalopram (CELEXA) 10 MG tablet Take 10 mg by mouth daily.   Yes Historical Provider, MD  feeding supplement (BOOST / RESOURCE BREEZE) LIQD Take 1 Container by mouth 3 (three) times daily between meals.   Yes Historical Provider, MD  furosemide (LASIX) 20 MG tablet Take 20 mg by mouth daily.    Yes Historical Provider, MD  pantoprazole (PROTONIX) 40 MG tablet Take 40 mg by mouth daily.    Yes Historical Provider, MD  polyethylene glycol powder (GLYCOLAX/MIRALAX) powder Take 17 g by mouth daily.   Yes Historical Provider, MD  potassium chloride (K-DUR,KLOR-CON) 10 MEQ tablet Take 1 tablet (10 mEq total) by mouth daily. 01/17/15  Yes Elliot Cousin, MD  promethazine (PHENERGAN) 25 MG tablet Take 25 mg by mouth every 6 (six) hours as needed for nausea or vomiting.   Yes Historical Provider, MD  risperiDONE (RISPERDAL) 0.5 MG tablet Take 0.5 mg by mouth at bedtime.  05/13/15  Yes Historical Provider, MD  senna-docusate (SENNA S) 8.6-50 MG tablet Take 2 tablets by mouth 2 (two)  times daily.   Yes Historical Provider, MD  Vitamin D, Ergocalciferol, (DRISDOL) 50000 units CAPS capsule Take 50,000 Units by mouth every 30 (thirty) days.   Yes Historical Provider, MD    Family History Family History  Problem Relation Age of Onset  . Diabetes Father     Social History Social History  Substance Use Topics  . Smoking status: Never Smoker  . Smokeless tobacco: Never Used  . Alcohol use No     Allergies   Meclizine; Macrobid [nitrofurantoin monohyd macro]; and Meloxicam   Review of Systems Review of Systems  Unable to perform ROS: Dementia  Constitutional: Negative for chills.  Gastrointestinal: Positive for vomiting.     Physical Exam Updated Vital Signs BP 113/79   Pulse 86   Temp 98.1 F (36.7 C)   Resp 16   Ht 5\' 4"  (1.626 m)   Wt 141 lb (64 kg)   SpO2 97%   BMI 24.20 kg/m   Physical Exam  Constitutional: She appears well-developed.  HENT:  Head: Normocephalic.  Eyes: Conjunctivae and EOM are normal. No scleral icterus.  Neck: Neck supple. No thyromegaly present.  Cardiovascular: Normal rate and regular rhythm.  Exam reveals no gallop and no friction rub.   No murmur heard. Pulmonary/Chest: No stridor. She has no wheezes. She has no rales. She exhibits no tenderness.  Abdominal: She exhibits no distension. There is no tenderness. There is no rebound.  Musculoskeletal: Normal range of motion. She exhibits no edema.  Lymphadenopathy:    She has no cervical adenopathy.  Neurological:  Initially patient was awake but would not answer questions,  daughter states that she does talk most of the time. After the patient was in the emergency department while she would only respond to painful stimuli  Skin: No rash noted. No erythema.     ED Treatments / Results  Labs (all labs ordered are listed, but only abnormal results are displayed) Labs Reviewed  CBC WITH DIFFERENTIAL/PLATELET - Abnormal; Notable for the following:       Result Value     WBC 13.7 (*)    Hemoglobin 15.2 (*)    Neutro Abs 12.9 (*)    Lymphs Abs 0.5 (*)    All other components within normal limits  COMPREHENSIVE METABOLIC PANEL - Abnormal; Notable for the following:    Chloride 98 (*)    Glucose, Bld 199 (*)    Calcium 8.7 (*)    GFR calc non Af Amer 52 (*)    All other components within normal limits  URINALYSIS, ROUTINE W REFLEX MICROSCOPIC  I-STAT CHEM 8, ED    EKG  EKG Interpretation None       Radiology Dg Chest 1 View  Result Date: 11/24/2016 CLINICAL DATA:  Hematemesis for 2 days EXAM: CHEST  1 VIEW COMPARISON:  01/17/2015 FINDINGS: Cardiac shadow is within normal limits. The lungs are well aerated bilaterally. No sizable effusion is seen. Patchy atelectatic changes are noted in the left base. No bony abnormality is noted. IMPRESSION: Left basilar atelectatic changes. Electronically Signed   By: Alcide Clever M.D.   On: 11/24/2016 16:41   Ct Head Wo Contrast  Result Date: 11/24/2016 CLINICAL DATA:  Hematemesis.  Mental status changes. EXAM: CT HEAD WITHOUT CONTRAST TECHNIQUE: Contiguous axial images were obtained from the base of the skull through the vertex without intravenous contrast. COMPARISON:  08/03/2016 FINDINGS: Brain: Generalized atrophy. Chronic small-vessel ischemic changes throughout the cerebral hemispheric white matter. No sign of acute infarction, mass lesion, hemorrhage, hydrocephalus or extra-axial collection. Vascular: There is atherosclerotic calcification of the major vessels at the base of the brain. Skull: Normal Sinuses/Orbits: Clear/normal Other: None significant IMPRESSION: No acute finding. Atrophy and chronic small-vessel ischemic changes. Unchanged from prior exams. Electronically Signed   By: Paulina Fusi M.D.   On: 11/24/2016 16:54    Procedures Procedures (including critical care time)  Medications Ordered in ED Medications  pantoprazole (PROTONIX) injection 40 mg (40 mg Intravenous Given 11/24/16 1441)  sodium  chloride 0.9 % bolus 500 mL (0 mLs Intravenous Stopped 11/24/16 1514)  ondansetron (ZOFRAN) injection 4 mg (4 mg Intravenous Given 11/24/16 1440)     Initial Impression / Assessment and Plan / ED Course  I have reviewed the triage vital signs and the nursing notes.  Pertinent labs & imaging results that were available during my care of the patient were reviewed by me and considered in my medical decision making (see chart for details).     Patient will be admitted for upper GI bleed and altered mental status. She is a DO NOT RESUSCITATE  Final Clinical Impressions(s) / ED Diagnoses   Final diagnoses:  Upper GI bleed    New Prescriptions New Prescriptions   No medications on file     Bethann Berkshire, MD 11/24/16 [phone]

## 2016-11-24 NOTE — H&P (Addendum)
TRH H&P    Patient Demographics:    Sherry Fox, is a 83 y.o. female  MRN: [id number]  DOB - 01-04-1930  Admit Date - 11/24/2016  Referring MD/NP/PA:   Outpatient Primary MD for the patient is Colon Branch, MD  Patient coming from: SNF  Chief Complaint  Patient presents with  . Hematemesis      HPI:    Sherry Fox  is a 83 y.o. female, With history of dementia was brought to the hospital with vomiting blood. As per patient's daughter she started having bloody vomitus at the skilled facility and had one episode in the hospital. Hemoglobin has been stable, patient is currently obtunded, only responds to pain.  There is no history of diarrhea. No chest pain or shortness of breath. No fever CT head was negative. GI was consulted by the ED physician, and they'll see the patient in a.m.    Review of systems:     Unable to obtain review of systems as patient has altered mental status   With Past History of the following :    Past Medical History:  Diagnosis Date  . Anemia   . Anxiety   . Arthritis   . Cardiac murmur   . Cellulitis of left leg 01/14/2015  . CHF (congestive heart failure) (HCC)   . CKD (chronic kidney disease) stage 3, GFR 30-59 ml/min 01/15/2015  . Dementia   . Diastolic dysfunction 01/17/2015   EF 60-65%. Grade 1 diastolic dysfunction.  Marland Kitchen Dysphagia   . Hypercholesteremia   . Hypertension   . Hypokalemia   . Hypokalemia   . Major depressive disorder   . Muscle weakness   . Psychoses   . Pulmonary edema       Past Surgical History:  Procedure Laterality Date  . KIDNEY CYST REMOVAL        Social History:      Social History  Substance Use Topics  . Smoking status: Never Smoker  . Smokeless tobacco: Never Used  . Alcohol use No       Family History :     Family History  Problem Relation Age of Onset  . Diabetes Father       Home Medications:   Prior to  Admission medications   Medication Sig Start Date End Date Taking? Authorizing Provider  acetaminophen (TYLENOL) 500 MG tablet Take 500 mg by mouth every 8 (eight) hours.    Yes Historical Provider, MD  aspirin EC 81 MG tablet Take 81 mg by mouth daily.   Yes Historical Provider, MD  calcium carbonate (TUMS - DOSED IN MG ELEMENTAL CALCIUM) 500 MG chewable tablet Chew 1 tablet by mouth 3 (three) times daily.   Yes Historical Provider, MD  carbidopa-levodopa (SINEMET IR) 25-100 MG per tablet Take 1/2 pill twice daily x 1 week, then 1/2 pill 3 times a day thereafter. Patient taking differently: Take 0.5 tablets by mouth 3 (three) times daily. Take 1/2 pill twice daily x 1 week, then 1/2 pill 3 times a day thereafter. 02/13/15  Yes  Huston Foley, MD  citalopram (CELEXA) 10 MG tablet Take 10 mg by mouth daily.   Yes Historical Provider, MD  feeding supplement (BOOST / RESOURCE BREEZE) LIQD Take 1 Container by mouth 3 (three) times daily between meals.   Yes Historical Provider, MD  furosemide (LASIX) 20 MG tablet Take 20 mg by mouth daily.    Yes Historical Provider, MD  pantoprazole (PROTONIX) 40 MG tablet Take 40 mg by mouth daily.    Yes Historical Provider, MD  polyethylene glycol powder (GLYCOLAX/MIRALAX) powder Take 17 g by mouth daily.   Yes Historical Provider, MD  potassium chloride (K-DUR,KLOR-CON) 10 MEQ tablet Take 1 tablet (10 mEq total) by mouth daily. 01/17/15  Yes Elliot Cousin, MD  promethazine (PHENERGAN) 25 MG tablet Take 25 mg by mouth every 6 (six) hours as needed for nausea or vomiting.   Yes Historical Provider, MD  risperiDONE (RISPERDAL) 0.5 MG tablet Take 0.5 mg by mouth at bedtime.  05/13/15  Yes Historical Provider, MD  senna-docusate (SENNA S) 8.6-50 MG tablet Take 2 tablets by mouth 2 (two) times daily.   Yes Historical Provider, MD  Vitamin D, Ergocalciferol, (DRISDOL) 50000 units CAPS capsule Take 50,000 Units by mouth every 30 (thirty) days.   Yes Historical Provider, MD      Allergies:     Allergies  Allergen Reactions  . Meclizine     Reaction is unknown  . Macrobid [Nitrofurantoin Monohyd Macro] Rash  . Meloxicam Rash and Other (See Comments)    This allergy is not listed on MAR from Nursing facility     Physical Exam:   Vitals  Blood pressure (!) 115/56, pulse 78, temperature 98.5 F (36.9 C), temperature source Axillary, resp. rate 16, height 5\' 2"  (1.575 m), weight 61.3 kg (135 lb 2.3 oz), SpO2 95 %.  1.  General: Obtunded, only responds to painful stimuli.  2. Psychiatric: As above  3. Neurologic: Could not complete neurological examination, as patient not cooperative.  4. Eyes :  anicteric sclerae, moist conjunctivae   5. ENMT:   moist mucous membranes   6. Neck:  supple, no cervical lymphadenopathy appriciated, No thyromegaly  7. Respiratory : Normal respiratory effort, good air movement bilaterally,clear to  auscultation bilaterally  8. Cardiovascular : RRR, no gallops, rubs or murmurs, no leg edema  9. Gastrointestinal:  Positive bowel sounds, abdomen soft, non-tender to palpation,no hepatosplenomegaly, no rigidity or guarding       10. Skin:  No cyanosis, normal texture and turgor, no rash, lesions or ulcers     Data Review:    CBC  Recent Labs Lab 11/24/16 1424  WBC 13.7*  HGB 15.2*  HCT 45.0  PLT 204  MCV 90.7  MCH 30.6  MCHC 33.8  RDW 13.5  LYMPHSABS 0.5*  MONOABS 0.4  EOSABS 0.0  BASOSABS 0.0   ------------------------------------------------------------------------------------------------------------------  Chemistries   Recent Labs Lab 11/24/16 1424  NA 138  K 3.5  CL 98*  CO2 29  GLUCOSE 199*  BUN 17  CREATININE 0.97  CALCIUM 8.7*  AST 23  ALT 23  ALKPHOS 59  BILITOT 1.1    ------------------------------------------------------------------------------------------------------------------  ------------------------------------------------------------------------------------------------------------------ GFR: Estimated Creatinine Clearance: 37.2 mL/min (by C-G formula based on SCr of 0.97 mg/dL). Liver Function Tests:  Recent Labs Lab 11/24/16 1424  AST 23  ALT 23  ALKPHOS 59  BILITOT 1.1  PROT 7.1  ALBUMIN 3.9    --------------------------------------------------------------------------------------------------------------- Urine analysis:    Component Value Date/Time   COLORURINE YELLOW 11/24/2016 1743  APPEARANCEUR CLEAR 11/24/2016 1743   LABSPEC 1.012 11/24/2016 1743   PHURINE 8.0 11/24/2016 1743   GLUCOSEU 50 (A) 11/24/2016 1743   HGBUR NEGATIVE 11/24/2016 1743   BILIRUBINUR NEGATIVE 11/24/2016 1743   BILIRUBINUR negative 01/10/2015 1159   KETONESUR 20 (A) 11/24/2016 1743   PROTEINUR 30 (A) 11/24/2016 1743   UROBILINOGEN 0.2 01/14/2015 1535   NITRITE NEGATIVE 11/24/2016 1743   LEUKOCYTESUR NEGATIVE 11/24/2016 1743      Imaging Results:    Dg Chest 1 View  Result Date: 11/24/2016 CLINICAL DATA:  Hematemesis for 2 days EXAM: CHEST 1 VIEW COMPARISON:  01/17/2015 FINDINGS: Cardiac shadow is within normal limits. The lungs are well aerated bilaterally. No sizable effusion is seen. Patchy atelectatic changes are noted in the left base. No bony abnormality is noted. IMPRESSION: Left basilar atelectatic changes. Electronically Signed   By: Alcide Clever M.D.   On: 11/24/2016 16:41   Ct Head Wo Contrast  Result Date: 11/24/2016 CLINICAL DATA:  Hematemesis.  Mental status changes. EXAM: CT HEAD WITHOUT CONTRAST TECHNIQUE: Contiguous axial images were obtained from the base of the skull through the vertex without intravenous contrast. COMPARISON:  08/03/2016 FINDINGS: Brain: Generalized atrophy. Chronic small-vessel ischemic changes throughout the  cerebral hemispheric white matter. No sign of acute infarction, mass lesion, hemorrhage, hydrocephalus or extra-axial collection. Vascular: There is atherosclerotic calcification of the major vessels at the base of the brain. Skull: Normal Sinuses/Orbits: Clear/normal Other: None significant IMPRESSION: No acute finding. Atrophy and chronic small-vessel ischemic changes. Unchanged from prior exams. Electronically Signed   By: Paulina Fusi M.D.   On: 11/24/2016 16:54    My personal review of EKG: Rhythm NSR   Assessment & Plan:    Active Problems:   Upper GI bleed   GI bleed   1. Hematemesis- will start patient on Protonix 40 mg IV every 12 hours, consult gastroenterology in a.m. Hemoglobin is stable at 15.2. Check H&H every 8 hours 2. Will transfuse as needed for active bleeding or hemoglobin less than 7. 2. Encephalopathy- no clear cause, no infectious source identified. Continue IV fluids with normal saline, monitor. CT head showed no acute stroke. Will check ammonia level 3. Dementia- stable. We will hold all by mouth medications at this time as patient unable to take by mouth due to altered mental status.     DVT Prophylaxis-   SCD  AM Labs Ordered, also please review Full Orders  Family Communication: Admission, patients condition and plan of care including tests being ordered have been discussed with the patient's daughter at bedside who indicate understanding and agree with the plan and Code Status.  Code Status: DO NOT RESUSCITATE  Admission status: Observation    Time spent in minutes : 60 minutes   Jeydan Barner S M.D on 11/24/2016 at 7:49 PM  Between 7am to 7pm - Pager - [phone]. After 7pm go to [email or web address] - password [facility]  [facility] - Office  [phone]

## 2016-11-24 NOTE — ED Triage Notes (Signed)
Per EMS-staff at [facility] reports hematemesis since Saturday. Per EMS approximately 200cc bloody emesis in route to ED. Pt is non-verbal.

## 2016-11-24 NOTE — ED Notes (Signed)
Mardella Layman, nurse at [facility], notified pt would be admitted.

## 2016-11-24 NOTE — ED Notes (Signed)
[name], [name] Home: [phone] Cell: [phone]

## 2016-11-25 ENCOUNTER — Encounter (HOSPITAL_COMMUNITY): Payer: Self-pay | Admitting: Gastroenterology

## 2016-11-25 DIAGNOSIS — K920 Hematemesis: Secondary | ICD-10-CM | POA: Diagnosis not present

## 2016-11-25 DIAGNOSIS — K922 Gastrointestinal hemorrhage, unspecified: Secondary | ICD-10-CM

## 2016-11-25 DIAGNOSIS — F0390 Unspecified dementia without behavioral disturbance: Secondary | ICD-10-CM

## 2016-11-25 DIAGNOSIS — I10 Essential (primary) hypertension: Secondary | ICD-10-CM | POA: Diagnosis not present

## 2016-11-25 DIAGNOSIS — G20 Parkinson's disease: Secondary | ICD-10-CM

## 2016-11-25 DIAGNOSIS — G9340 Encephalopathy, unspecified: Secondary | ICD-10-CM | POA: Diagnosis not present

## 2016-11-25 LAB — COMPREHENSIVE METABOLIC PANEL
ALK PHOS: 46 U/L (ref 38–126)
ALT: 15 U/L (ref 14–54)
AST: 12 U/L — ABNORMAL LOW (ref 15–41)
Albumin: 3.1 g/dL — ABNORMAL LOW (ref 3.5–5.0)
Anion gap: 7 (ref 5–15)
BUN: 23 mg/dL — AB (ref 6–20)
CALCIUM: 8 mg/dL — AB (ref 8.9–10.3)
CO2: 28 mmol/L (ref 22–32)
CREATININE: 0.95 mg/dL (ref 0.44–1.00)
Chloride: 106 mmol/L (ref 101–111)
GFR, EST NON AFRICAN AMERICAN: 53 mL/min — AB (ref >60)
Glucose, Bld: 102 mg/dL — ABNORMAL HIGH (ref 65–99)
Potassium: 2.8 mmol/L — ABNORMAL LOW (ref 3.5–5.1)
SODIUM: 141 mmol/L (ref 135–145)
Total Bilirubin: 0.9 mg/dL (ref 0.3–1.2)
Total Protein: 5.7 g/dL — ABNORMAL LOW (ref 6.5–8.1)

## 2016-11-25 LAB — MAGNESIUM: Magnesium: 2 mg/dL (ref 1.7–2.4)

## 2016-11-25 LAB — HEMOGLOBIN AND HEMATOCRIT, BLOOD
HCT: 37.9 % (ref 36.0–46.0)
HEMATOCRIT: 36.1 % (ref 36.0–46.0)
HEMOGLOBIN: 12 g/dL (ref 12.0–15.0)
Hemoglobin: 12.3 g/dL (ref 12.0–15.0)

## 2016-11-25 MED ORDER — PANTOPRAZOLE SODIUM 40 MG PO TBEC
40.0000 mg | DELAYED_RELEASE_TABLET | Freq: Two times a day (BID) | ORAL | Status: DC
  Administered 2016-11-25 – 2016-11-27 (×4): 40 mg via ORAL
  Filled 2016-11-25 (×4): qty 1

## 2016-11-25 MED ORDER — POTASSIUM CHLORIDE CRYS ER 20 MEQ PO TBCR
40.0000 meq | EXTENDED_RELEASE_TABLET | ORAL | Status: AC
  Administered 2016-11-25 (×2): 40 meq via ORAL
  Filled 2016-11-25 (×2): qty 2

## 2016-11-25 MED ORDER — CARBIDOPA-LEVODOPA 25-100 MG PO TABS
0.5000 | ORAL_TABLET | Freq: Three times a day (TID) | ORAL | Status: DC
  Administered 2016-11-25 – 2016-11-27 (×6): 0.5 via ORAL
  Filled 2016-11-25 (×6): qty 1

## 2016-11-25 MED ORDER — POTASSIUM CHLORIDE 10 MEQ/100ML IV SOLN
10.0000 meq | INTRAVENOUS | Status: AC
  Administered 2016-11-25 (×6): 10 meq via INTRAVENOUS
  Filled 2016-11-25 (×6): qty 100

## 2016-11-25 MED ORDER — CITALOPRAM HYDROBROMIDE 20 MG PO TABS
10.0000 mg | ORAL_TABLET | Freq: Every day | ORAL | Status: DC
  Administered 2016-11-25 – 2016-11-27 (×3): 10 mg via ORAL
  Filled 2016-11-25 (×3): qty 1

## 2016-11-25 MED ORDER — RISPERIDONE 0.5 MG PO TABS
0.5000 mg | ORAL_TABLET | Freq: Every day | ORAL | Status: DC
  Administered 2016-11-25 – 2016-11-26 (×2): 0.5 mg via ORAL
  Filled 2016-11-25 (×2): qty 1

## 2016-11-25 NOTE — Clinical Social Work Note (Signed)
Clinical Social Work Assessment  Patient Details  Name: Sherry Fox MRN: [id number] Date of Birth: 1930-01-10  Date of referral:  11/25/16               Reason for consult:  Discharge Planning                Permission sought to share information with:  Chartered certified accountant granted to share information::  Yes, Verbal Permission Granted  Name::        Agency::  [facility]  Relationship::  facility  Contact Information:     Housing/Transportation Living arrangements for the past 2 months:  [place] of Information:  Adult Children Patient Interpreter Needed:  None Criminal Activity/Legal Involvement Pertinent to Current Situation/Hospitalization:  No - Comment as needed Significant Relationships:  Adult Children Lives with:  Facility Resident Do you feel safe going back to the place where you live?  Yes Need for family participation in patient care:  Yes (Comment)  Care giving concerns:  Pt is long term resident at [facility].    Social Worker assessment / plan:  CSW met with pt's daughter, Butch Penny at bedside. Butch Penny states she is also legal guardian. Pt has been a resident at [facility] for almost 2 years. Butch Penny visits daily. She reports pt has been falling a lot recently and is now using a wheelchair for safety. Per Larene Beach at facility, pt is nursing level of care and okay to return.   Employment status:  Retired Nurse, adult PT Recommendations:  Not assessed at this time Information / Referral to community resources:  Other (Comment Required) (Return to SNF)  Patient/Family's Response to care:  [name] asked about hospice. After discussion of this and prognosis, she is not sure that this is appropriate. She anticipates that pt will return to [facility] at d/c.   Patient/Family's Understanding of and Emotional Response to Diagnosis, Current Treatment, and Prognosis:  [name] plans to speak to  doctor today for update.  Emotional Assessment Appearance:  Appears stated age Attitude/Demeanor/Rapport:  Unable to Assess Affect (typically observed):  Unable to Assess Orientation:   (disoriented x4) Alcohol / Substance use:  Not Applicable Psych involvement (Current and /or in the community):  No (Comment)  Discharge Needs  Concerns to be addressed:  Discharge Planning Concerns Readmission within the last 30 days:  No Current discharge risk:  Cognitively Impaired Barriers to Discharge:  Continued Medical Work up   [employer], [place] 11/25/2016, 9:34 AM [phone]

## 2016-11-25 NOTE — Care Management Obs Status (Signed)
MEDICARE OBSERVATION STATUS NOTIFICATION   Patient Details  Name: Sherry Fox MRN: [id number] Date of Birth: 1930/02/21   Medicare Observation Status Notification Given:  Yes    Arnesha Schiraldi, Chrystine Oiler, RN 11/25/2016, 4:10 PM

## 2016-11-25 NOTE — Progress Notes (Signed)
PROGRESS NOTE    Sherry Fox  WGN:[id number] DOB: [birth year] DOA: 11/24/2016 PCP: Colon Branch, MD    Brief Narrative:  83 year old female with a history of dementia, brought to the emergency room from skilled nursing facility after noted to have bloody vomit. She was unable to participate in exam, was not answering any questions due to mental status. No reported history of diarrhea, chest pain or shortness of breath. She was admitted for further evaluation of hematemesis.   Assessment & Plan:   Active Problems:   Essential hypertension, benign   Dementia   Encephalopathy   Upper GI bleed   GI bleed   Parkinson's disease (HCC)   1. Hematemesis. Likely related to upper GI bleeding. No evidence of ongoing bleeding at this time. She has fallen. She is currently on Protonix twice a day. Plan is for endoscopy once hypokalemia is better. Hemoglobin has been stable.  2. Encephalopathy, superimposed on dementia. On admission, she was noted to be unresponsive in the emergency room. At this time, she is awake but confused. Likely had an element of dehydration. Has improved with IV fluids. Urinalysis did not show any signs of infection.  3. Parkinson's disease. Continue on Sinemet.  4. Hypertension. Lasix is currently on hold.    DVT prophylaxis: scd Code Status: DNR Family Communication: discussed with daughter at the bedside Disposition Plan: discharge back to SNF when stable   Consultants:   gastroenterology  Procedures:     Antimicrobials:       Subjective: No bloody bowel movements or hematemesis. She is more awake today but confused  Objective: Vitals:   11/24/16 1939 11/24/16 1950 11/25/16 0510 11/25/16 1550  BP: (!) 115/56  (!) 131/56 128/66  Pulse: 78  71 90  Resp: 16  18 18   Temp: 98.5 F (36.9 C)  97.8 F (36.6 C) 97.7 F (36.5 C)  TempSrc: Axillary  Oral Oral  SpO2: 95% 94% 95% 96%  Weight: 61.3 kg (135 lb 2.3 oz)     Height: 5\' 2"  (1.575 m)        Intake/Output Summary (Last 24 hours) at 11/25/16 1612 Last data filed at 11/25/16 0530  Gross per 24 hour  Intake           683.75 ml  Output                0 ml  Net           683.75 ml   Filed Weights   11/24/16 1412 11/24/16 1939  Weight: 64 kg (141 lb) 61.3 kg (135 lb 2.3 oz)    Examination:  General exam: Appears calm and comfortable  Respiratory system: Clear to auscultation. Respiratory effort normal. Cardiovascular system: S1 & S2 heard, RRR. No JVD, murmurs, rubs, gallops or clicks. No pedal edema. Gastrointestinal system: Abdomen is nondistended, soft and nontender. No organomegaly or masses felt. Normal bowel sounds heard. Central nervous system: Alert, confused. No focal neurological deficits. Extremities: Symmetric 5 x 5 power. Skin: No rashes, lesions or ulcers Psychiatry: confused     Data Reviewed: I have personally reviewed following labs and imaging studies  CBC:  Recent Labs Lab 11/24/16 1424 11/24/16 2047 11/25/16 0419 11/25/16 1241  WBC 13.7*  --   --   --   NEUTROABS 12.9*  --   --   --   HGB 15.2* 13.5 12.3 12.0  HCT 45.0 40.6 37.9 36.1  MCV 90.7  --   --   --  PLT 204  --   --   --    Basic Metabolic Panel:  Recent Labs Lab 11/24/16 1424 11/25/16 0419 11/25/16 1241  NA 138 141  --   K 3.5 2.8*  --   CL 98* 106  --   CO2 29 28  --   GLUCOSE 199* 102*  --   BUN 17 23*  --   CREATININE 0.97 0.95  --   CALCIUM 8.7* 8.0*  --   MG  --   --  2.0   GFR: Estimated Creatinine Clearance: 38 mL/min (by C-G formula based on SCr of 0.95 mg/dL). Liver Function Tests:  Recent Labs Lab 11/24/16 1424 11/25/16 0419  AST 23 12*  ALT 23 15  ALKPHOS 59 46  BILITOT 1.1 0.9  PROT 7.1 5.7*  ALBUMIN 3.9 3.1*   No results for input(s): LIPASE, AMYLASE in the last 168 hours.  Recent Labs Lab 11/24/16 2047  AMMONIA 19   Coagulation Profile: No results for input(s): INR, PROTIME in the last 168 hours. Cardiac Enzymes: No results  for input(s): CKTOTAL, CKMB, CKMBINDEX, TROPONINI in the last 168 hours. BNP (last 3 results) No results for input(s): PROBNP in the last 8760 hours. HbA1C: No results for input(s): HGBA1C in the last 72 hours. CBG: No results for input(s): GLUCAP in the last 168 hours. Lipid Profile: No results for input(s): CHOL, HDL, LDLCALC, TRIG, CHOLHDL, LDLDIRECT in the last 72 hours. Thyroid Function Tests: No results for input(s): TSH, T4TOTAL, FREET4, T3FREE, THYROIDAB in the last 72 hours. Anemia Panel: No results for input(s): VITAMINB12, FOLATE, FERRITIN, TIBC, IRON, RETICCTPCT in the last 72 hours. Sepsis Labs: No results for input(s): PROCALCITON, LATICACIDVEN in the last 168 hours.  Recent Results (from the past 240 hour(s))  MRSA PCR Screening     Status: None   Collection Time: 11/24/16  7:58 PM  Result Value Ref Range Status   MRSA by PCR NEGATIVE NEGATIVE Final    Comment:        The GeneXpert MRSA Assay (FDA approved for NASAL specimens only), is one component of a comprehensive MRSA colonization surveillance program. It is not intended to diagnose MRSA infection nor to guide or monitor treatment for MRSA infections.          Radiology Studies: Dg Chest 1 View  Result Date: 11/24/2016 CLINICAL DATA:  Hematemesis for 2 days EXAM: CHEST 1 VIEW COMPARISON:  01/17/2015 FINDINGS: Cardiac shadow is within normal limits. The lungs are well aerated bilaterally. No sizable effusion is seen. Patchy atelectatic changes are noted in the left base. No bony abnormality is noted. IMPRESSION: Left basilar atelectatic changes. Electronically Signed   By: Alcide Clever M.D.   On: 11/24/2016 16:41   Ct Head Wo Contrast  Result Date: 11/24/2016 CLINICAL DATA:  Hematemesis.  Mental status changes. EXAM: CT HEAD WITHOUT CONTRAST TECHNIQUE: Contiguous axial images were obtained from the base of the skull through the vertex without intravenous contrast. COMPARISON:  08/03/2016 FINDINGS: Brain:  Generalized atrophy. Chronic small-vessel ischemic changes throughout the cerebral hemispheric white matter. No sign of acute infarction, mass lesion, hemorrhage, hydrocephalus or extra-axial collection. Vascular: There is atherosclerotic calcification of the major vessels at the base of the brain. Skull: Normal Sinuses/Orbits: Clear/normal Other: None significant IMPRESSION: No acute finding. Atrophy and chronic small-vessel ischemic changes. Unchanged from prior exams. Electronically Signed   By: Paulina Fusi M.D.   On: 11/24/2016 16:54        Scheduled Meds: . carbidopa-levodopa  0.5 tablet Oral TID  . citalopram  10 mg Oral Daily  . pantoprazole  40 mg Oral BID AC  . potassium chloride  10 mEq Intravenous Q1 Hr x 6  . potassium chloride  40 mEq Oral Q3H  . risperiDONE  0.5 mg Oral QHS   Continuous Infusions: . sodium chloride 50 mL/hr at 11/25/16 1523     LOS: 0 days    Time spent:    MEMON,JEHANZEB, MD [facility] Pager [phone]  If 7PM-7AM, please contact night-coverage [email or web address] Password [email or web address] 11/25/2016, 4:12 PM

## 2016-11-25 NOTE — Consult Note (Signed)
Referring Provider: Dr. Sharl Ma  Primary Care Physician:  Colon Branch, MD Primary Gastroenterologist:  Dr. Darrick Penna   Date of Admission: 11/24/16 Date of Consultation: 11/25/16  Reason for Consultation:  GI bleed   HPI:  Sherry Fox is an 83 y.o. year old female with a history of dementia, presenting to the ED with reports of hematemesis. Per ED notes, EMS documented approximately 200 cc of blood emesis en route to ED. Admitting Hgb 15.2, today 12.3. Appears baseline is in 12 range. She is unable to give a history, but she is more alert today per her daughter. Still not back to baseline, which was moving around in a wheelchair on her own, alert, and speaking to others although confused. Daughter, Sherry Fox, is present at bedside.   States she brought her mother [facility] on Saturday, which she ate well. Later that evening, the facility contacted Sherry Fox stating her mother had vomited. Sunday had decreased appetite and further vomiting. "Red" emesis reported to daughter yesterday, with about 5 episodes of vomiting that day. Noted a cough starting Saturday. Thinks her mom used to take ibuprofen, but this is not on the outpatient med list. States her mom has complained of indigestion and has received Tums on multiple prior occasions. Mom complains of "burning" in her throat. On Protonix daily. Deals with chronic constipation and will at times vomit while significantly constipated. No prior EGD or prior similar episodes.   Nursing staff denies any evidence of melena.   Past Medical History:  Diagnosis Date  . Anemia   . Anxiety   . Arthritis   . Cardiac murmur   . Cellulitis of left leg 01/14/2015  . CHF (congestive heart failure) (HCC)   . CKD (chronic kidney disease) stage 3, GFR 30-59 ml/min 01/15/2015  . Dementia   . Diastolic dysfunction 01/17/2015   EF 60-65%. Grade 1 diastolic dysfunction.  Marland Kitchen Dysphagia   . Hypercholesteremia   . Hypertension   . Hypokalemia   . Hypokalemia   . Major  depressive disorder   . Muscle weakness   . Psychoses   . Pulmonary edema     Past Surgical History:  Procedure Laterality Date  . KIDNEY CYST REMOVAL      Prior to Admission medications   Medication Sig Start Date End Date Taking? Authorizing Provider  acetaminophen (TYLENOL) 500 MG tablet Take 500 mg by mouth every 8 (eight) hours.    Yes Historical Provider, MD  aspirin EC 81 MG tablet Take 81 mg by mouth daily.   Yes Historical Provider, MD  calcium carbonate (TUMS - DOSED IN MG ELEMENTAL CALCIUM) 500 MG chewable tablet Chew 1 tablet by mouth 3 (three) times daily.   Yes Historical Provider, MD  carbidopa-levodopa (SINEMET IR) 25-100 MG per tablet Take 1/2 pill twice daily x 1 week, then 1/2 pill 3 times a day thereafter. Patient taking differently: Take 0.5 tablets by mouth 3 (three) times daily. Take 1/2 pill twice daily x 1 week, then 1/2 pill 3 times a day thereafter. 02/13/15  Yes Huston Foley, MD  citalopram (CELEXA) 10 MG tablet Take 10 mg by mouth daily.   Yes Historical Provider, MD  feeding supplement (BOOST / RESOURCE BREEZE) LIQD Take 1 Container by mouth 3 (three) times daily between meals.   Yes Historical Provider, MD  furosemide (LASIX) 20 MG tablet Take 20 mg by mouth daily.    Yes Historical Provider, MD  pantoprazole (PROTONIX) 40 MG tablet Take 40 mg by mouth daily.  Yes Historical Provider, MD  polyethylene glycol powder (GLYCOLAX/MIRALAX) powder Take 17 g by mouth daily.   Yes Historical Provider, MD  potassium chloride (K-DUR,KLOR-CON) 10 MEQ tablet Take 1 tablet (10 mEq total) by mouth daily. 01/17/15  Yes Elliot Cousin, MD  promethazine (PHENERGAN) 25 MG tablet Take 25 mg by mouth every 6 (six) hours as needed for nausea or vomiting.   Yes Historical Provider, MD  risperiDONE (RISPERDAL) 0.5 MG tablet Take 0.5 mg by mouth at bedtime.  05/13/15  Yes Historical Provider, MD  senna-docusate (SENNA S) 8.6-50 MG tablet Take 2 tablets by mouth 2 (two) times daily.   Yes  Historical Provider, MD  Vitamin D, Ergocalciferol, (DRISDOL) 50000 units CAPS capsule Take 50,000 Units by mouth every 30 (thirty) days.   Yes Historical Provider, MD    Current Facility-Administered Medications  Medication Dose Route Frequency Provider Last Rate Last Dose  . 0.9 %  sodium chloride infusion   Intravenous Continuous Meredeth Ide, MD 75 mL/hr at 11/25/16 0930    . ondansetron (ZOFRAN) tablet 4 mg  4 mg Oral Q6H PRN Meredeth Ide, MD       Or  . ondansetron (ZOFRAN) injection 4 mg  4 mg Intravenous Q6H PRN Meredeth Ide, MD      . pantoprazole (PROTONIX) injection 40 mg  40 mg Intravenous Q12H Meredeth Ide, MD   40 mg at 11/25/16 0930    Allergies as of 11/24/2016 - Review Complete 11/24/2016  Allergen Reaction Noted  . Meclizine  11/24/2016  . Macrobid [nitrofurantoin monohyd macro] Rash 01/14/2015  . Meloxicam Rash and Other (See Comments) 06/02/2011    Family History  Problem Relation Age of Onset  . Diabetes Father   . Colon cancer Neg Hx     Social History   Social History  . Marital status: Widowed    Spouse name: N/A  . Number of children: 2  . Years of education: 10th grade   Occupational History  . Not on file.   Social History Main Topics  . Smoking status: Former Games developer  . Smokeless tobacco: Never Used     Comment: quit 30 years ago   . Alcohol use No  . Drug use: No  . Sexual activity: Not on file   Other Topics Concern  . Not on file   Social History Narrative   1 Mt Dew a day     Review of Systems: Unable to obtain due to cognitive status   Physical Exam: Vital signs in last 24 hours: Temp:  [97.8 F (36.6 C)-98.5 F (36.9 C)] 97.8 F (36.6 C) (02/06 0510) Pulse Rate:  [71-92] 71 (02/06 0510) Resp:  [12-22] 18 (02/06 0510) BP: (104-137)/(56-92) 131/56 (02/06 0510) SpO2:  [94 %-98 %] 95 % (02/06 0510) Weight:  [135 lb 2.3 oz (61.3 kg)-141 lb (64 kg)] 135 lb 2.3 oz (61.3 kg) (02/05 1939) Last BM Date: 11/24/16 General:    Resting with eyes closed but opens briefly to verbal stimuli. Mumbling confused sentences, speaking of the "sun in my eyes" and wanting to go home.  Head:  Normocephalic and atraumatic. Eyes:  Sclera clear, no icterus.    Ears:  Normal auditory acuity. Mouth:  Edentulous  Lungs:  Scattered rhonchi, unlabored breathing  Heart:  S1 S2 present without murmurs  Abdomen:  Soft, nontender and nondistended. No masses, hepatosplenomegaly or hernias noted. Normal bowel sounds, without guarding, and without rebound.   Rectal:  Deferred  Msk:  Without  gross deformities  Extremities:  Without  edema. Neurologic:  Awakens to verbal stimuli, unable to assess orientation due to cognitive status  Psych:  Flat affect  Intake/Output from previous day: 02/05 0701 - 02/06 0700 In: 1183.8 [I.V.:683.8; IV Piggyback:500] Out: -  Intake/Output this shift: No intake/output data recorded.  Lab Results:  Recent Labs  11/24/16 1424 11/24/16 2047 11/25/16 0419  WBC 13.7*  --   --   HGB 15.2* 13.5 12.3  HCT 45.0 40.6 37.9  PLT 204  --   --    BMET  Recent Labs  11/24/16 1424 11/25/16 0419  NA 138 141  K 3.5 2.8*  CL 98* 106  CO2 29 28  GLUCOSE 199* 102*  BUN 17 23*  CREATININE 0.97 0.95  CALCIUM 8.7* 8.0*   LFT  Recent Labs  11/24/16 1424 11/25/16 0419  PROT 7.1 5.7*  ALBUMIN 3.9 3.1*  AST 23 12*  ALT 23 15  ALKPHOS 59 46  BILITOT 1.1 0.9   Studies/Results: Dg Chest 1 View  Result Date: 11/24/2016 CLINICAL DATA:  Hematemesis for 2 days EXAM: CHEST 1 VIEW COMPARISON:  01/17/2015 FINDINGS: Cardiac shadow is within normal limits. The lungs are well aerated bilaterally. No sizable effusion is seen. Patchy atelectatic changes are noted in the left base. No bony abnormality is noted. IMPRESSION: Left basilar atelectatic changes. Electronically Signed   By: Alcide Clever M.D.   On: 11/24/2016 16:41   Ct Head Wo Contrast  Result Date: 11/24/2016 CLINICAL DATA:  Hematemesis.  Mental status  changes. EXAM: CT HEAD WITHOUT CONTRAST TECHNIQUE: Contiguous axial images were obtained from the base of the skull through the vertex without intravenous contrast. COMPARISON:  08/03/2016 FINDINGS: Brain: Generalized atrophy. Chronic small-vessel ischemic changes throughout the cerebral hemispheric white matter. No sign of acute infarction, mass lesion, hemorrhage, hydrocephalus or extra-axial collection. Vascular: There is atherosclerotic calcification of the major vessels at the base of the brain. Skull: Normal Sinuses/Orbits: Clear/normal Other: None significant IMPRESSION: No acute finding. Atrophy and chronic small-vessel ischemic changes. Unchanged from prior exams. Electronically Signed   By: Paulina Fusi M.D.   On: 11/24/2016 16:54    Impression: 83 year old female with history of dementia, presenting with hematemesis. Hgb dropped from admission (15.2 to 12.3) likely multifactorial in setting of fluid resuscitation and hematemesis. With repetitive vomiting, query MW tear as culprit, possible esophagitis. Daughter reports patient complaining of reflux symptoms as outpatient, taking Tums intermittently despite PPI daily. Hemodynamically stable currently and without further vomiting since admission. No melena. Significant hypokalemia with potassium 2.8; will need replacement prior to endoscopic procedure. Anticipate EGD on 11/26/16. Discussed with daughter at bedside.   Constipation: per daughter. Will follow clinically. May need stronger agent as outpatient (low dose Linzess or Amitiza).   Plan: Continue Protonix IV BID  Repeat CBC, BMP in am  Hypokalemia per attending May have sips of clears NPO after midnight Tentative plans for EGD on 11/26/16   Gelene Mink, ANP-BC [facility]    LOS: 0 days    11/25/2016, 10:38 AM

## 2016-11-25 NOTE — NC FL2 (Signed)
  [place] MEDICAID FL2 LEVEL OF CARE SCREENING TOOL     IDENTIFICATION  Patient Name: Sherry Fox Birthdate: 11-Jun-1930 Sex: female Admission Date (Current Location): 11/24/2016  [place] and [place] Number:  Aaron Edelman [id number] N Facility and Address:  [facility],  [address], [place] [address]      Provider Number: [id number]  Attending Physician Name and Address:  Erick Blinks, MD  Relative Name and Phone Number:       Current Level of Care: Hospital Recommended Level of Care: Skilled Nursing Facility Prior Approval Number:    Date Approved/Denied:   PASRR Number: [id number]  Discharge Plan: SNF    Current Diagnoses: Patient Active Problem List   Diagnosis Date Noted  . Upper GI bleed 11/24/2016  . GI bleed 11/24/2016  . Diastolic dysfunction 01/17/2015  . CKD (chronic kidney disease) stage 3, GFR 30-59 ml/min 01/15/2015  . Rash and nonspecific skin eruption 01/15/2015  . Hypotension 01/14/2015  . Sepsis (HCC) 01/14/2015  . Cellulitis of left leg 01/14/2015  . Dementia 01/14/2015  . Encephalopathy 01/14/2015  . Acute renal failure (HCC) 01/14/2015  . Hallucination 03/03/2014  . Altered mental status 03/02/2014  . Hypnagogic hallucinations 03/02/2014  . Depression 03/02/2014  . Essential hypertension, benign 04/11/2013  . Morbid obesity (HCC) 04/11/2013  . Other and unspecified hyperlipidemia 04/11/2013  . Diverticulosis of colon without hemorrhage 04/11/2013  . DJD (degenerative joint disease) 04/11/2013    Orientation RESPIRATION BLADDER Height & Weight      (disoriented x4)  Normal Incontinent Weight: 135 lb 2.3 oz (61.3 kg) Height:  5\' 2"  (157.5 cm)  BEHAVIORAL SYMPTOMS/MOOD NEUROLOGICAL BOWEL NUTRITION STATUS  Other (Comment) (none)  (n/a) Incontinent Diet (NPO time specified. See d/c summary for updates)  AMBULATORY STATUS COMMUNICATION OF NEEDS Skin   Extensive Assist Verbally Skin abrasions, Bruising                    Personal Care Assistance Level of Assistance  Bathing, Feeding, Dressing Bathing Assistance: Maximum assistance Feeding assistance: Maximum assistance Dressing Assistance: Maximum assistance     Functional Limitations Info  Sight, Hearing, Speech Sight Info: Impaired Hearing Info: Impaired Speech Info: Adequate    SPECIAL CARE FACTORS FREQUENCY                       Contractures      Additional Factors Info  Code Status, Allergies Code Status Info: DNR Allergies Info: Meclizine, Macrobid (Nitrofurantoin Monohyd Macro), Meloxicam           Current Medications (11/25/2016):  This is the current hospital active medication list Current Facility-Administered Medications  Medication Dose Route Frequency Provider Last Rate Last Dose  . 0.9 %  sodium chloride infusion   Intravenous Continuous Meredeth Ide, MD 75 mL/hr at 11/25/16 0930    . ondansetron (ZOFRAN) tablet 4 mg  4 mg Oral Q6H PRN Meredeth Ide, MD       Or  . ondansetron (ZOFRAN) injection 4 mg  4 mg Intravenous Q6H PRN Meredeth Ide, MD      . pantoprazole (PROTONIX) injection 40 mg  40 mg Intravenous Q12H Meredeth Ide, MD   40 mg at 11/25/16 0930     Discharge Medications: Please see discharge summary for a list of discharge medications.  Relevant Imaging Results:  Relevant Lab Results:   Additional Information    Karn Cassis, [place] [phone]

## 2016-11-26 ENCOUNTER — Encounter (HOSPITAL_COMMUNITY)
Admission: EM | Disposition: A | Discharge status: Skilled Nursing Facility | Payer: Self-pay | Source: Home / Self Care | Attending: Emergency Medicine

## 2016-11-26 ENCOUNTER — Encounter (HOSPITAL_COMMUNITY): Payer: Self-pay | Admitting: *Deleted

## 2016-11-26 DIAGNOSIS — K920 Hematemesis: Secondary | ICD-10-CM | POA: Diagnosis not present

## 2016-11-26 DIAGNOSIS — K209 Esophagitis, unspecified: Secondary | ICD-10-CM

## 2016-11-26 DIAGNOSIS — G9340 Encephalopathy, unspecified: Secondary | ICD-10-CM | POA: Diagnosis not present

## 2016-11-26 DIAGNOSIS — F0390 Unspecified dementia without behavioral disturbance: Secondary | ICD-10-CM | POA: Diagnosis not present

## 2016-11-26 DIAGNOSIS — I10 Essential (primary) hypertension: Secondary | ICD-10-CM | POA: Diagnosis not present

## 2016-11-26 DIAGNOSIS — K922 Gastrointestinal hemorrhage, unspecified: Secondary | ICD-10-CM | POA: Diagnosis not present

## 2016-11-26 HISTORY — PX: ESOPHAGOGASTRODUODENOSCOPY: SHX5428

## 2016-11-26 LAB — BASIC METABOLIC PANEL
ANION GAP: 9 (ref 5–15)
BUN: 18 mg/dL (ref 6–20)
CO2: 23 mmol/L (ref 22–32)
Calcium: 7.9 mg/dL — ABNORMAL LOW (ref 8.9–10.3)
Chloride: 107 mmol/L (ref 101–111)
Creatinine, Ser: 0.82 mg/dL (ref 0.44–1.00)
Glucose, Bld: 72 mg/dL (ref 65–99)
Potassium: 4.2 mmol/L (ref 3.5–5.1)
SODIUM: 139 mmol/L (ref 135–145)

## 2016-11-26 LAB — CBC
HCT: 39 % (ref 36.0–46.0)
Hemoglobin: 12.5 g/dL (ref 12.0–15.0)
MCH: 29.7 pg (ref 26.0–34.0)
MCHC: 32.1 g/dL (ref 30.0–36.0)
MCV: 92.6 fL (ref 78.0–100.0)
PLATELETS: 168 10*3/uL (ref 150–400)
RBC: 4.21 MIL/uL (ref 3.87–5.11)
RDW: 13.9 % (ref 11.5–15.5)
WBC: 8.3 10*3/uL (ref 4.0–10.5)

## 2016-11-26 SURGERY — EGD (ESOPHAGOGASTRODUODENOSCOPY)
Anesthesia: Moderate Sedation

## 2016-11-26 MED ORDER — LIDOCAINE VISCOUS 2 % MT SOLN
OROMUCOSAL | Status: DC | PRN
  Administered 2016-11-26: 1 via OROMUCOSAL

## 2016-11-26 MED ORDER — MEPERIDINE HCL 100 MG/ML IJ SOLN
INTRAMUSCULAR | Status: AC
  Filled 2016-11-26: qty 2

## 2016-11-26 MED ORDER — SODIUM CHLORIDE 0.9 % IV SOLN: INTRAVENOUS | Status: DC

## 2016-11-26 MED ORDER — MEPERIDINE HCL 100 MG/ML IJ SOLN
INTRAMUSCULAR | Status: DC | PRN
  Administered 2016-11-26: 25 mg via INTRAVENOUS

## 2016-11-26 MED ORDER — STERILE WATER FOR IRRIGATION IR SOLN
Status: DC | PRN
  Administered 2016-11-26: 2.5 mL

## 2016-11-26 MED ORDER — MIDAZOLAM HCL 5 MG/5ML IJ SOLN
INTRAMUSCULAR | Status: DC | PRN
Start: 2016-11-26 — End: 2016-11-26
  Administered 2016-11-26: 1 mg via INTRAVENOUS

## 2016-11-26 MED ORDER — SUCRALFATE 1 GM/10ML PO SUSP
1.0000 g | Freq: Three times a day (TID) | ORAL | Status: DC
  Administered 2016-11-26 – 2016-11-27 (×3): 1 g via ORAL
  Filled 2016-11-26 (×3): qty 10

## 2016-11-26 MED ORDER — ONDANSETRON HCL 4 MG/2ML IJ SOLN
INTRAMUSCULAR | Status: AC
  Filled 2016-11-26: qty 2

## 2016-11-26 MED ORDER — MIDAZOLAM HCL 5 MG/5ML IJ SOLN
INTRAMUSCULAR | Status: AC
  Filled 2016-11-26: qty 10

## 2016-11-26 MED ORDER — LIDOCAINE VISCOUS 2 % MT SOLN
OROMUCOSAL | Status: AC
  Filled 2016-11-26: qty 15

## 2016-11-26 NOTE — Progress Notes (Signed)
PROGRESS NOTE    Sherry Fox  ZOX:[id number] DOB: 04-23-1930 DOA: 11/24/2016 PCP: Colon Branch, MD    Brief Narrative:  83 year old female with a history of dementia, brought to the emergency room from skilled nursing facility after noted to have bloody vomit. She was unable to participate in exam, was not answering any questions due to mental status. No reported history of diarrhea, chest pain or shortness of breath. She was admitted for further evaluation of hematemesis.   Assessment & Plan:   Active Problems:   Essential hypertension, benign   Dementia   Encephalopathy   Upper GI bleed   GI bleed   Parkinson's disease (HCC)   1. Hematemesis. Likely related to upper GI bleeding. No evidence of ongoing bleeding at this time. Seen by GI and underwent EGD today that showed severe esophagitis. She is currently on Protonix twice a day and is also prescribed Carafate.. Hemoglobin has been stable.  2. Encephalopathy, superimposed on dementia. On admission, she was noted to be unresponsive in the emergency room. At this time, she is awake but confused. Likely had an element of dehydration. Has improved with IV fluids. Urinalysis did not show any signs of infection. Likely near baseline.  3. Parkinson's disease. Continue on Sinemet.  4. Hypertension. Lasix is currently on hold.    DVT prophylaxis: scd Code Status: DNR Family Communication: discussed with daughter at the bedside Disposition Plan: discharge back to SNF when stable, likely in AM   Consultants:   gastroenterology  Procedures:  EGD: - Esophagitis. Severe. Recent pill/food impaction                            not excluded.                           - Small hiatal hernia.                           - The examination was otherwise normal.                           - Normal duodenal bulb and second portion of the                            duodenum.                            - No specimens  collected.  Antimicrobials:       Subjective: Patient has been sleeping the majority of the day today. No vomiting.  Objective: Vitals:   11/26/16 1455 11/26/16 1500 11/26/16 1505 11/26/16 1548  BP:    (!) 106/58  Pulse: 88 84 84 74  Resp: 20 19 17 16   Temp:    97.9 F (36.6 C)  TempSrc:    Axillary  SpO2: 98% 98% 97% 95%  Weight:      Height:        Intake/Output Summary (Last 24 hours) at 11/26/16 1756 Last data filed at 11/26/16 1506  Gross per 24 hour  Intake          1143.33 ml  Output              350 ml  Net  793.33 ml   Filed Weights   11/24/16 1412 11/24/16 1939 11/26/16 0650  Weight: 64 kg (141 lb) 61.3 kg (135 lb 2.3 oz) 61.3 kg (135 lb 2.3 oz)    Examination:  General exam: Appears calm and comfortable  Respiratory system: Clear to auscultation. Respiratory effort normal. Cardiovascular system: S1 & S2 heard, RRR. No JVD, murmurs, rubs, gallops or clicks. No pedal edema. Gastrointestinal system: Abdomen is nondistended, soft and nontender. No organomegaly or masses felt. Normal bowel sounds heard. Central nervous system: Alert, confused. No focal neurological deficits. Extremities: Symmetric 5 x 5 power. Skin: No rashes, lesions or ulcers Psychiatry: confused     Data Reviewed: I have personally reviewed following labs and imaging studies  CBC:  Recent Labs Lab 11/24/16 1424 11/24/16 2047 11/25/16 0419 11/25/16 1241 11/26/16 0553  WBC 13.7*  --   --   --  8.3  NEUTROABS 12.9*  --   --   --   --   HGB 15.2* 13.5 12.3 12.0 12.5  HCT 45.0 40.6 37.9 36.1 39.0  MCV 90.7  --   --   --  92.6  PLT 204  --   --   --  168   Basic Metabolic Panel:  Recent Labs Lab 11/24/16 1424 11/25/16 0419 11/25/16 1241 11/26/16 0553  NA 138 141  --  139  K 3.5 2.8*  --  4.2  CL 98* 106  --  107  CO2 29 28  --  23  GLUCOSE 199* 102*  --  72  BUN 17 23*  --  18  CREATININE 0.97 0.95  --  0.82  CALCIUM 8.7* 8.0*  --  7.9*  MG  --   --  2.0   --    GFR: Estimated Creatinine Clearance: 44 mL/min (by C-G formula based on SCr of 0.82 mg/dL). Liver Function Tests:  Recent Labs Lab 11/24/16 1424 11/25/16 0419  AST 23 12*  ALT 23 15  ALKPHOS 59 46  BILITOT 1.1 0.9  PROT 7.1 5.7*  ALBUMIN 3.9 3.1*   No results for input(s): LIPASE, AMYLASE in the last 168 hours.  Recent Labs Lab 11/24/16 2047  AMMONIA 19   Coagulation Profile: No results for input(s): INR, PROTIME in the last 168 hours. Cardiac Enzymes: No results for input(s): CKTOTAL, CKMB, CKMBINDEX, TROPONINI in the last 168 hours. BNP (last 3 results) No results for input(s): PROBNP in the last 8760 hours. HbA1C: No results for input(s): HGBA1C in the last 72 hours. CBG: No results for input(s): GLUCAP in the last 168 hours. Lipid Profile: No results for input(s): CHOL, HDL, LDLCALC, TRIG, CHOLHDL, LDLDIRECT in the last 72 hours. Thyroid Function Tests: No results for input(s): TSH, T4TOTAL, FREET4, T3FREE, THYROIDAB in the last 72 hours. Anemia Panel: No results for input(s): VITAMINB12, FOLATE, FERRITIN, TIBC, IRON, RETICCTPCT in the last 72 hours. Sepsis Labs: No results for input(s): PROCALCITON, LATICACIDVEN in the last 168 hours.  Recent Results (from the past 240 hour(s))  MRSA PCR Screening     Status: None   Collection Time: 11/24/16  7:58 PM  Result Value Ref Range Status   MRSA by PCR NEGATIVE NEGATIVE Final    Comment:        The GeneXpert MRSA Assay (FDA approved for NASAL specimens only), is one component of a comprehensive MRSA colonization surveillance program. It is not intended to diagnose MRSA infection nor to guide or monitor treatment for MRSA infections.  Radiology Studies: No results found.      Scheduled Meds: . carbidopa-levodopa  0.5 tablet Oral TID  . citalopram  10 mg Oral Daily  . lidocaine      . meperidine      . midazolam      . ondansetron      . pantoprazole  40 mg Oral BID AC  .  risperiDONE  0.5 mg Oral QHS  . sucralfate  1 g Oral TID WC & HS   Continuous Infusions: . sodium chloride 50 mL/hr at 11/26/16 1529     LOS: 0 days    Time spent:    MEMON,JEHANZEB, MD [facility] Pager [phone]  If 7PM-7AM, please contact night-coverage [email or web address] Password [facility] 11/26/2016, 5:56 PM

## 2016-11-26 NOTE — Op Note (Signed)
[facility] Patient Name: Sherry Fox Procedure Date: 11/26/2016 1:47 PM MRN: [id number] Date of Birth: 29-Nov-1929 Attending MD: Gennette Pac , MD CSN: [id number] Age: 83 Admit Type: Inpatient Procedure:                Upper GI endoscopy Indications:              Hematemesis Providers:                Gennette Pac, MD, Nena Polio, RN, Dyann Ruddle Referring MD:              Medicines:                Midazolam 3 mg IV, Meperidine 25 mg IV, Ondansetron                            4 mg IV Complications:            No immediate complications. Estimated Blood Loss:     Estimated blood loss was minimal. Procedure:                Pre-Anesthesia Assessment:                           - Prior to the procedure, a History and Physical                            was performed, and patient medications and                            allergies were reviewed. The patient's tolerance of                            previous anesthesia was also reviewed. The risks                            and benefits of the procedure and the sedation                            options and risks were discussed with the patient.                            All questions were answered, and informed consent                            was obtained. Prior Anticoagulants: The patient has                            taken no previous anticoagulant or antiplatelet                            agents. ASA Grade Assessment: III - A patient with  severe systemic disease. After reviewing the risks                            and benefits, the patient was deemed in                            satisfactory condition to undergo the procedure.                           After obtaining informed consent, the endoscope was                            passed under direct vision. Throughout the                            procedure, the patient's blood pressure, pulse, and                        oxygen saturations were monitored continuously. The                            [id number] ([id number]) scope was introduced through the                            mouth, and advanced to the second part of duodenum.                            The upper GI endoscopy was accomplished without                            difficulty. The patient tolerated the procedure                            well. Scope In: 2:08:22 PM Scope Out: 2:14:13 PM Total Procedure Duration: 0 hours 5 minutes 51 seconds  Findings:      Severe distal and mid esophagitis with excoriations and overlying       exudate. No plaquess. Extensive linear erosions. No obvious Barrett's       epithelium. Tubular esophagus patent throughout its course..      A small hiatal hernia was present. Stomach otherwise normal.      The duodenal bulb and second portion of the duodenum were normal. Impression:               - Esophagitis. Severe. Recent pill/food impaction                            not excluded.                           - Small hiatal hernia.                           - The examination was otherwise normal.                           - Normal duodenal  bulb and second portion of the                            duodenum.                           - No specimens collected. Moderate Sedation:      Moderate (conscious) sedation was administered by the endoscopy nurse       and supervised by the endoscopist. The following parameters were       monitored: oxygen saturation, heart rate, blood pressure, respiratory       rate, EKG, adequacy of pulmonary ventilation, and response to care.       Total physician intraservice time was 12 minutes. Recommendation:           - Return patient to hospital ward for ongoing care.                           - Pureed diet.                           - Use Protonix (pantoprazole) 40 mg PO BID x months                            then daily indefinitely thereafter.                            - Use sucralfate tablets 1 gram PO TID x 2 weeks.                            Swallowing precautions reviewed with patient's                            daughter. Dysphagia 2 diet                           - Continue present medications. Procedure Code(s):        --- Professional ---                           [phone], Esophagogastroduodenoscopy, flexible,                            transoral; diagnostic, including collection of                            specimen(s) by brushing or washing, when performed                            (separate procedure)                           99152, Moderate sedation services provided by the                            same physician or other qualified health care  professional performing the diagnostic or                            therapeutic service that the sedation supports,                            requiring the presence of an independent trained                            observer to assist in the monitoring of the                            patient's level of consciousness and physiological                            status; initial 15 minutes of intraservice time,                            patient age 42 years or older Diagnosis Code(s):        --- Professional ---                           K20.9, Esophagitis, unspecified                           K44.9, Diaphragmatic hernia without obstruction or                            gangrene                           K92.0, Hematemesis CPT copyright 2016 [employer]. All rights reserved. The codes documented in this report are preliminary and upon coder review may  be revised to meet current compliance requirements. [name] [name], MD Gennette Pac, MD 11/26/2016 2:37:39 PM This report has been signed electronically. Number of Addenda: 0

## 2016-11-26 NOTE — Progress Notes (Signed)
Patient's potassium has been adequately corrected. Plan for EGD today. Orders placed.   Leanna Battles. [facility] [phone] 2/7/20187:56 AM

## 2016-11-26 NOTE — Plan of Care (Signed)
Problem: Bowel/Gastric: Goal: Will show no signs and symptoms of gastrointestinal bleeding Outcome: Progressing Pt  Has had no s/s blood in stool

## 2016-11-27 DIAGNOSIS — K922 Gastrointestinal hemorrhage, unspecified: Secondary | ICD-10-CM | POA: Diagnosis not present

## 2016-11-27 DIAGNOSIS — I10 Essential (primary) hypertension: Secondary | ICD-10-CM | POA: Diagnosis not present

## 2016-11-27 DIAGNOSIS — G9340 Encephalopathy, unspecified: Secondary | ICD-10-CM | POA: Diagnosis not present

## 2016-11-27 DIAGNOSIS — F0390 Unspecified dementia without behavioral disturbance: Secondary | ICD-10-CM | POA: Diagnosis not present

## 2016-11-27 DIAGNOSIS — K920 Hematemesis: Secondary | ICD-10-CM | POA: Diagnosis not present

## 2016-11-27 MED ORDER — FUROSEMIDE 20 MG PO TABS: 20.0000 mg | ORAL_TABLET | Freq: Every day | ORAL | Status: AC | PRN

## 2016-11-27 MED ORDER — PANTOPRAZOLE SODIUM 40 MG PO TBEC: 40.0000 mg | DELAYED_RELEASE_TABLET | Freq: Two times a day (BID) | ORAL | 1 refills | Status: AC

## 2016-11-27 MED ORDER — SUCRALFATE 1 GM/10ML PO SUSP: 1.0000 g | Freq: Three times a day (TID) | ORAL | 0 refills | Status: AC

## 2016-11-27 NOTE — Discharge Summary (Addendum)
Physician Discharge Summary  Sherry Fox ZOX:[id number] DOB: July 15, 1930 DOA: 11/24/2016  PCP: Colon Branch, MD  Admit date: 11/24/2016 Discharge date: 11/27/2016  Admitted From: SNF Disposition:  SNF  Recommendations for Outpatient Follow-up:  1. Follow up with PCP in 1-2 weeks 2. Please obtain BMP/CBC in one week 3. Follow up with GI as needed 4. If patient does not have improving po intake, would strongly recommend hospice evaluation, since she is likely approaching end of life with dementia/parkinsons  Home Health: Equipment/Devices:  Discharge Condition:stable CODE STATUS: DNR Diet recommendation: dysphagia 2 diet, thin liquids, heart healthy  Brief/Interim Summary: 83 year old female with a history of dementia, brought to the emergency room from skilled nursing facility after noted to have bloody vomit. She was unable to participate in exam, was not answering any questions due to mental status. No reported history of diarrhea, chest pain or shortness of breath. She was admitted for further evaluation of hematemesis.  Discharge Diagnoses:  Active Problems:   Essential hypertension, benign   Dementia   Encephalopathy   Upper GI bleed   GI bleed   Parkinson's disease (HCC)  1. Hematemesis. Related to upper GI bleeding. No evidence of ongoing bleeding at this time. Seen by GI and underwent EGD today that showed severe esophagitis. She is currently on Protonix twice a day and is also prescribed Carafate. Hemoglobin has been stable.She will return to see on Protonix twice a day for the next 2 months and then daily indefinitely. She'll be on Carafate for the next 2 weeks.  2. Encephalopathy, superimposed on dementia. On admission, she was noted to be unresponsive in the emergency room. At this time, she is awake but confused. Likely had an element of dehydration. Has improved with IV fluids. Urinalysis did not show any signs of infection. Mental status waxes and wanes. Likely near  baseline.  3. Parkinson's disease. Continue on Sinemet.  4. Hypertension. Blood pressures have been stable. Change lasix to prn on discharge to avoid dehydration  5. Hypokalemia. Replaced. Mag normal  Discharge Instructions  Discharge Instructions    Diet - low sodium heart healthy    Complete by:  As directed    Increase activity slowly    Complete by:  As directed      Allergies as of 11/27/2016      Reactions   Meclizine    Reaction is unknown   Macrobid [nitrofurantoin Monohyd Macro] Rash   Meloxicam Rash, Other (See Comments)   This allergy is not listed on MAR from Nursing facility      Medication List    TAKE these medications   acetaminophen 500 MG tablet Commonly known as:  TYLENOL Take 500 mg by mouth every 8 (eight) hours.   aspirin EC 81 MG tablet Take 81 mg by mouth daily.   calcium carbonate 500 MG chewable tablet Commonly known as:  TUMS - dosed in mg elemental calcium Chew 1 tablet by mouth 3 (three) times daily.   carbidopa-levodopa 25-100 MG tablet Commonly known as:  SINEMET IR Take 1/2 pill twice daily x 1 week, then 1/2 pill 3 times a day thereafter. What changed:  how much to take  how to take this  when to take this  additional instructions   citalopram 10 MG tablet Commonly known as:  CELEXA Take 10 mg by mouth daily.   feeding supplement Liqd Take 1 Container by mouth 3 (three) times daily between meals.   furosemide 20 MG tablet Commonly known as:  LASIX Take 1 tablet (20 mg total) by mouth daily as needed for fluid. What changed:  when to take this  reasons to take this   pantoprazole 40 MG tablet Commonly known as:  PROTONIX Take 1 tablet (40 mg total) by mouth 2 (two) times daily before a meal. What changed:  when to take this   polyethylene glycol powder powder Commonly known as:  GLYCOLAX/MIRALAX Take 17 g by mouth daily.   potassium chloride 10 MEQ tablet Commonly known as:  K-DUR,KLOR-CON Take 1 tablet  (10 mEq total) by mouth daily.   promethazine 25 MG tablet Commonly known as:  PHENERGAN Take 25 mg by mouth every 6 (six) hours as needed for nausea or vomiting.   risperiDONE 0.5 MG tablet Commonly known as:  RISPERDAL Take 0.5 mg by mouth at bedtime.   SENNA S 8.6-50 MG tablet Generic drug:  senna-docusate Take 2 tablets by mouth 2 (two) times daily.   sucralfate 1 GM/10ML suspension Commonly known as:  CARAFATE Take 10 mLs (1 g total) by mouth 4 (four) times daily -  with meals and at bedtime.   Vitamin D (Ergocalciferol) 50000 units Caps capsule Commonly known as:  DRISDOL Take 50,000 Units by mouth every 30 (thirty) days.       Allergies  Allergen Reactions  . Meclizine     Reaction is unknown  . Macrobid [Nitrofurantoin Monohyd Macro] Rash  . Meloxicam Rash and Other (See Comments)    This allergy is not listed on MAR from Nursing facility    Consultations:  Gastroenterology   Procedures/Studies: Dg Chest 1 View  Result Date: 11/24/2016 CLINICAL DATA:  Hematemesis for 2 days EXAM: CHEST 1 VIEW COMPARISON:  01/17/2015 FINDINGS: Cardiac shadow is within normal limits. The lungs are well aerated bilaterally. No sizable effusion is seen. Patchy atelectatic changes are noted in the left base. No bony abnormality is noted. IMPRESSION: Left basilar atelectatic changes. Electronically Signed   By: Alcide Clever M.D.   On: 11/24/2016 16:41   Ct Head Wo Contrast  Result Date: 11/24/2016 CLINICAL DATA:  Hematemesis.  Mental status changes. EXAM: CT HEAD WITHOUT CONTRAST TECHNIQUE: Contiguous axial images were obtained from the base of the skull through the vertex without intravenous contrast. COMPARISON:  08/03/2016 FINDINGS: Brain: Generalized atrophy. Chronic small-vessel ischemic changes throughout the cerebral hemispheric white matter. No sign of acute infarction, mass lesion, hemorrhage, hydrocephalus or extra-axial collection. Vascular: There is atherosclerotic  calcification of the major vessels at the base of the brain. Skull: Normal Sinuses/Orbits: Clear/normal Other: None significant IMPRESSION: No acute finding. Atrophy and chronic small-vessel ischemic changes. Unchanged from prior exams. Electronically Signed   By: Paulina Fusi M.D.   On: 11/24/2016 16:54    EGD: - Esophagitis. Severe. Recent pill/food impaction  not excluded. - Small hiatal hernia. - The examination was otherwise normal. - Normal duodenal bulb and second portion of the  duodenum.  - No specimens collected.   Subjective: No further vomiting.  Discharge Exam: Vitals:   11/26/16 2124 11/27/16 0533  BP: 110/63 118/71  Pulse: 68 66  Resp: 16 16  Temp: 97.8 F (36.6 C) 98 F (36.7 C)   Vitals:   11/26/16 1505 11/26/16 1548 11/26/16 2124 11/27/16 0533  BP:  (!) 106/58 110/63 118/71  Pulse: 84 74 68 66  Resp: 17 16 16 16   Temp:  97.9 F (36.6 C) 97.8 F (36.6 C) 98 F (36.7 C)  TempSrc:  Axillary Axillary Axillary  SpO2: 97% 95%  95% 95%  Weight:    61.3 kg (135 lb 2.6 oz)  Height:        General: Pt is alert, awake, not in acute distress Cardiovascular: RRR, S1/S2 +, no rubs, no gallops Respiratory: CTA bilaterally, no wheezing, no rhonchi Abdominal: Soft, NT, ND, bowel sounds + Extremities: no edema, no cyanosis    The results of significant diagnostics from this hospitalization (including imaging, microbiology, ancillary and laboratory) are listed below for reference.     Microbiology: Recent Results (from the past 240 hour(s))  MRSA PCR Screening     Status: None   Collection Time: 11/24/16  7:58 PM  Result Value Ref Range Status   MRSA by PCR NEGATIVE NEGATIVE Final    Comment:        The GeneXpert MRSA Assay (FDA approved for NASAL specimens only), is one component of a comprehensive MRSA  colonization surveillance program. It is not intended to diagnose MRSA infection nor to guide or monitor treatment for MRSA infections.      Labs: BNP (last 3 results) No results for input(s): BNP in the last 8760 hours. Basic Metabolic Panel:  Recent Labs Lab 11/24/16 1424 11/25/16 0419 11/25/16 1241 11/26/16 0553  NA 138 141  --  139  K 3.5 2.8*  --  4.2  CL 98* 106  --  107  CO2 29 28  --  23  GLUCOSE 199* 102*  --  72  BUN 17 23*  --  18  CREATININE 0.97 0.95  --  0.82  CALCIUM 8.7* 8.0*  --  7.9*  MG  --   --  2.0  --    Liver Function Tests:  Recent Labs Lab 11/24/16 1424 11/25/16 0419  AST 23 12*  ALT 23 15  ALKPHOS 59 46  BILITOT 1.1 0.9  PROT 7.1 5.7*  ALBUMIN 3.9 3.1*   No results for input(s): LIPASE, AMYLASE in the last 168 hours.  Recent Labs Lab 11/24/16 2047  AMMONIA 19   CBC:  Recent Labs Lab 11/24/16 1424 11/24/16 2047 11/25/16 0419 11/25/16 1241 11/26/16 0553  WBC 13.7*  --   --   --  8.3  NEUTROABS 12.9*  --   --   --   --   HGB 15.2* 13.5 12.3 12.0 12.5  HCT 45.0 40.6 37.9 36.1 39.0  MCV 90.7  --   --   --  92.6  PLT 204  --   --   --  168   Cardiac Enzymes: No results for input(s): CKTOTAL, CKMB, CKMBINDEX, TROPONINI in the last 168 hours. BNP: Invalid input(s): POCBNP CBG: No results for input(s): GLUCAP in the last 168 hours. D-Dimer No results for input(s): DDIMER in the last 72 hours. Hgb A1c No results for input(s): HGBA1C in the last 72 hours. Lipid Profile No results for input(s): CHOL, HDL, LDLCALC, TRIG, CHOLHDL, LDLDIRECT in the last 72 hours. Thyroid function studies No results for input(s): TSH, T4TOTAL, T3FREE, THYROIDAB in the last 72 hours.  Invalid input(s): FREET3 Anemia work up No results for input(s): VITAMINB12, FOLATE, FERRITIN, TIBC, IRON, RETICCTPCT in the last 72 hours. Urinalysis    Component Value Date/Time   COLORURINE YELLOW 11/24/2016 1743   APPEARANCEUR CLEAR 11/24/2016 1743    LABSPEC 1.012 11/24/2016 1743   PHURINE 8.0 11/24/2016 1743   GLUCOSEU 50 (A) 11/24/2016 1743   HGBUR NEGATIVE 11/24/2016 1743   BILIRUBINUR NEGATIVE 11/24/2016 1743   BILIRUBINUR negative 01/10/2015 1159   KETONESUR 20 (A)  11/24/2016 1743   PROTEINUR 30 (A) 11/24/2016 1743   UROBILINOGEN 0.2 01/14/2015 1535   NITRITE NEGATIVE 11/24/2016 1743   LEUKOCYTESUR NEGATIVE 11/24/2016 1743   Sepsis Labs Invalid input(s): PROCALCITONIN,  WBC,  LACTICIDVEN Microbiology Recent Results (from the past 240 hour(s))  MRSA PCR Screening     Status: None   Collection Time: 11/24/16  7:58 PM  Result Value Ref Range Status   MRSA by PCR NEGATIVE NEGATIVE Final    Comment:        The GeneXpert MRSA Assay (FDA approved for NASAL specimens only), is one component of a comprehensive MRSA colonization surveillance program. It is not intended to diagnose MRSA infection nor to guide or monitor treatment for MRSA infections.      Time coordinating discharge: Over 30 minutes  SIGNED:   Erick Blinks, MD  [facility] 11/27/2016, 11:28 AM Pager   If 7PM-7AM, please contact night-coverage [email or web address] Password [email or web address]

## 2016-11-27 NOTE — Progress Notes (Signed)
Patient is to be discharged back to SNF in stable condition. IV and telemetry removed. Report called to Merry Proud, Charity fundraiser at [facility]. No further questions or concerns at this time.  Quita Skye, RN

## 2016-11-27 NOTE — Clinical Social Work Note (Signed)
Pt d/c today back to [facility]. [name] Lupita Leash and facility aware and agreeable. Will transport via [facility].  Derenda Fennel, LCSW [phone]

## 2016-12-03 ENCOUNTER — Encounter (HOSPITAL_COMMUNITY): Payer: Self-pay | Admitting: Internal Medicine

## 2017-04-19 DEATH — deceased
